# Patient Record
Sex: Female | Born: 1960 | Hispanic: No | Marital: Married | Smoking: Former smoker
Health system: Southern US, Community
[De-identification: ages and names within clinical notes are randomized; demographics above are authoritative.]

## PROBLEM LIST (undated history)

## (undated) DIAGNOSIS — G4700 Insomnia, unspecified: Secondary | ICD-10-CM

## (undated) DIAGNOSIS — Z803 Family history of malignant neoplasm of breast: Secondary | ICD-10-CM

## (undated) DIAGNOSIS — R61 Generalized hyperhidrosis: Secondary | ICD-10-CM

## (undated) DIAGNOSIS — R5383 Other fatigue: Secondary | ICD-10-CM

## (undated) HISTORY — DX: Insomnia, unspecified: G47.00

## (undated) HISTORY — DX: Other fatigue: R53.83

## (undated) HISTORY — PX: KIDNEY SURGERY: SHX687

## (undated) HISTORY — DX: Generalized hyperhidrosis: R61

## (undated) HISTORY — DX: Family history of malignant neoplasm of breast: Z80.3

## (undated) HISTORY — PX: FOOT SURGERY: SHX648

---

## 2004-07-06 ENCOUNTER — Ambulatory Visit: Payer: Self-pay | Admitting: Unknown Physician Specialty

## 2005-09-14 ENCOUNTER — Ambulatory Visit: Payer: Self-pay | Admitting: Unknown Physician Specialty

## 2005-09-22 ENCOUNTER — Ambulatory Visit: Payer: Self-pay | Admitting: Unknown Physician Specialty

## 2006-09-26 ENCOUNTER — Ambulatory Visit: Payer: Self-pay

## 2007-10-20 ENCOUNTER — Emergency Department: Payer: Self-pay | Admitting: Emergency Medicine

## 2007-11-05 ENCOUNTER — Ambulatory Visit: Payer: Self-pay | Admitting: Unknown Physician Specialty

## 2007-11-18 ENCOUNTER — Ambulatory Visit: Payer: Self-pay | Admitting: Unknown Physician Specialty

## 2008-05-08 HISTORY — PX: COLONOSCOPY: SHX174

## 2008-11-17 ENCOUNTER — Ambulatory Visit: Payer: Self-pay | Admitting: Unknown Physician Specialty

## 2009-07-08 ENCOUNTER — Ambulatory Visit: Payer: Self-pay | Admitting: Family Medicine

## 2010-01-13 ENCOUNTER — Ambulatory Visit: Payer: Self-pay | Admitting: Unknown Physician Specialty

## 2011-06-28 ENCOUNTER — Ambulatory Visit: Payer: Self-pay | Admitting: Unknown Physician Specialty

## 2013-06-23 ENCOUNTER — Ambulatory Visit: Payer: Self-pay | Admitting: Podiatry

## 2013-07-01 ENCOUNTER — Encounter: Payer: Self-pay | Admitting: Podiatry

## 2013-07-02 ENCOUNTER — Ambulatory Visit: Payer: Self-pay | Admitting: Podiatry

## 2014-06-02 ENCOUNTER — Ambulatory Visit: Payer: Self-pay | Admitting: Family Medicine

## 2014-11-04 ENCOUNTER — Telehealth: Payer: Self-pay

## 2014-11-04 NOTE — Telephone Encounter (Signed)
LMTCB-received a form for biometric screening from [employer]-i think it is for her job-patient needs appt if she wants this to be filled out-either follow up or CPE after July 7th. Thanks-aa

## 2014-11-04 NOTE — Telephone Encounter (Signed)
Pt advised and appt made-aa 

## 2014-12-30 ENCOUNTER — Ambulatory Visit (INDEPENDENT_AMBULATORY_CARE_PROVIDER_SITE_OTHER): Payer: BLUE CROSS/BLUE SHIELD | Admitting: Family Medicine

## 2014-12-30 ENCOUNTER — Encounter: Payer: Self-pay | Admitting: Family Medicine

## 2014-12-30 VITALS — BP 128/84 | HR 72 | Temp 97.8°F | Resp 14 | Ht 66.25 in | Wt 169.0 lb

## 2014-12-30 DIAGNOSIS — G4700 Insomnia, unspecified: Secondary | ICD-10-CM

## 2014-12-30 DIAGNOSIS — Z139 Encounter for screening, unspecified: Secondary | ICD-10-CM

## 2014-12-30 DIAGNOSIS — Z0000 Encounter for general adult medical examination without abnormal findings: Secondary | ICD-10-CM

## 2014-12-30 MED ORDER — ALPRAZOLAM 0.5 MG PO TABS: 0.5000 mg | ORAL_TABLET | Freq: Every day | ORAL | Status: DC

## 2014-12-30 NOTE — Progress Notes (Signed)
Patient ID: Tonya Guzman, female   DOB: 11/01/1960, 54 y.o.   MRN: [id number] Visit Date: 12/30/2014  Today's Provider: Wilhemena Durie, MD   Chief Complaint  Patient presents with  . Annual Exam   Subjective:  Tonya Guzman is a 54 y.o. female who presents today for health maintenance and complete physical. She feels well. She reports exercising speriadically. She reports she is sleeping well.  LAST: Colonoscopy 01/05/11  EKG-07/16/09  Tdap 03/13/07  Pap smear -2015 per patient with Dr. Vernie Ammons.  Mammogram 2015 per patient.  Review of Systems  Constitutional: Negative.   HENT: Negative.   Eyes: Negative.   Respiratory: Negative.   Cardiovascular: Negative.   Gastrointestinal: Negative.   Endocrine: Negative.   Genitourinary: Negative.   Musculoskeletal: Negative.   Skin: Negative.   Allergic/Immunologic: Negative.   Neurological: Negative.   Hematological: Negative.   Psychiatric/Behavioral: Negative.     Social History   Social History  . Marital Status: Married    Spouse Name: N/A  . Number of Children: N/A  . Years of Education: N/A   Occupational History  . Not on file.   Social History Main Topics  . Smoking status: Former Smoker -- 0.25 packs/day for 1 years    Types: Cigarettes    Quit date: 05/08/2012  . Smokeless tobacco: Never Used  . Alcohol Use: Yes     Comment: 3 drinks a week  . Drug Use: No  . Sexual Activity: Yes   Other Topics Concern  . Not on file   Social History Narrative    There are no active problems to display for this patient.   Past Surgical History  Procedure Laterality Date  . Cesarean section    . Foot surgery Right   . Kidney surgery      Her family history includes Heart attack in her father; Heart disease in her father; Hepatitis C in her brother; Hyperlipidemia in her father; Hypertension in her father, mother, sister, and sister; Pneumonia in her mother.    Outpatient Prescriptions Prior to Visit  Medication  Sig Dispense Refill  . ALREX 0.2 % SUSP     . ESTRACE VAGINAL 0.1 MG/GM vaginal cream     . NASONEX 50 MCG/ACT nasal spray     . ALPRAZolam (XANAX) 0.5 MG tablet     . clarithromycin (BIAXIN) 500 MG tablet      No facility-administered medications prior to visit.    Patient Care Team: Jerrol Banana., MD as PCP - General (Family Medicine)     Objective:   Vitals:  Filed Vitals:   12/30/14 1044  BP: 128/84  Pulse: 72  Temp: 97.8 F (36.6 C)  Resp: 14  Height: 5' 6.25" (1.683 m)  Weight: 169 lb (76.658 kg)    Physical Exam  Constitutional: She is oriented to person, place, and time. She appears well-developed and well-nourished.  HENT:  Head: Normocephalic and atraumatic.  Right Ear: External ear normal.  Left Ear: External ear normal.  Nose: Nose normal.  Eyes: Conjunctivae are normal.  Neck: Neck supple.  Cardiovascular: Normal rate, regular rhythm and normal heart sounds.   Pulmonary/Chest: Effort normal and breath sounds normal.  Abdominal: Soft. Bowel sounds are normal.  Neurological: She is alert and oriented to person, place, and time.  Skin: Skin is warm and dry.  Psychiatric: She has a normal mood and affect. Her behavior is normal. Judgment and thought content normal.     Depression Screen  PHQ 2/9 Scores 12/30/2014  PHQ - 2 Score 0      Assessment & Plan:   1. Annual physical exam  - CBC with Differential/Platelet - Comprehensive Metabolic Panel (CMET) - Lipid Panel With LDL/HDL Ratio - TSH  2. Insomnia  - ALPRAZolam (XANAX) 0.5 MG tablet; Take 1 tablet (0.5 mg total) by mouth at bedtime.  Dispense: 30 tablet; Refill: 5  3. Screening  - Nicotine/cotinine metabolites   I have done the exam and reviewed the above chart and it is accurate to the best of my knowledge.

## 2015-01-03 LAB — NICOTINE/COTININE METABOLITES
Cotinine: 23.5 ng/mL
Nicotine: 2.9 ng/mL

## 2015-01-03 LAB — CBC WITH DIFFERENTIAL/PLATELET
BASOS: 1 %
Basophils Absolute: 0 10*3/uL (ref 0.0–0.2)
EOS (ABSOLUTE): 0.1 10*3/uL (ref 0.0–0.4)
Eos: 2 %
HEMOGLOBIN: 13.4 g/dL (ref 11.1–15.9)
Hematocrit: 40.7 % (ref 34.0–46.6)
IMMATURE GRANS (ABS): 0 10*3/uL (ref 0.0–0.1)
IMMATURE GRANULOCYTES: 0 %
LYMPHS: 28 %
Lymphocytes Absolute: 1.3 10*3/uL (ref 0.7–3.1)
MCH: 30.9 pg (ref 26.6–33.0)
MCHC: 32.9 g/dL (ref 31.5–35.7)
MCV: 94 fL (ref 79–97)
MONOCYTES: 10 %
Monocytes Absolute: 0.5 10*3/uL (ref 0.1–0.9)
NEUTROS PCT: 59 %
Neutrophils Absolute: 2.7 10*3/uL (ref 1.4–7.0)
Platelets: 262 10*3/uL (ref 150–379)
RBC: 4.33 x10E6/uL (ref 3.77–5.28)
RDW: 13.8 % (ref 12.3–15.4)
WBC: 4.5 10*3/uL (ref 3.4–10.8)

## 2015-01-03 LAB — COMPREHENSIVE METABOLIC PANEL
A/G RATIO: 1.7 (ref 1.1–2.5)
ALT: 21 IU/L (ref 0–32)
AST: 19 IU/L (ref 0–40)
Albumin: 4.7 g/dL (ref 3.5–5.5)
Alkaline Phosphatase: 53 IU/L (ref 39–117)
BUN/Creatinine Ratio: 14 (ref 9–23)
BUN: 12 mg/dL (ref 6–24)
Bilirubin Total: 0.5 mg/dL (ref 0.0–1.2)
CALCIUM: 9.8 mg/dL (ref 8.7–10.2)
CO2: 24 mmol/L (ref 18–29)
Chloride: 100 mmol/L (ref 97–108)
Creatinine, Ser: 0.86 mg/dL (ref 0.57–1.00)
GFR, EST AFRICAN AMERICAN: 88 mL/min/{1.73_m2} (ref >59)
GFR, EST NON AFRICAN AMERICAN: 76 mL/min/{1.73_m2} (ref >59)
Globulin, Total: 2.7 g/dL (ref 1.5–4.5)
Glucose: 99 mg/dL (ref 65–99)
POTASSIUM: 4.8 mmol/L (ref 3.5–5.2)
Sodium: 140 mmol/L (ref 134–144)
TOTAL PROTEIN: 7.4 g/dL (ref 6.0–8.5)

## 2015-01-03 LAB — LIPID PANEL WITH LDL/HDL RATIO
Cholesterol, Total: 263 mg/dL — ABNORMAL HIGH (ref 100–199)
HDL: 75 mg/dL (ref >39)
LDL Calculated: 169 mg/dL — ABNORMAL HIGH (ref 0–99)
LDL/HDL RATIO: 2.3 ratio (ref 0.0–3.2)
Triglycerides: 96 mg/dL (ref 0–149)
VLDL CHOLESTEROL CAL: 19 mg/dL (ref 5–40)

## 2015-01-03 LAB — TSH: TSH: 2.03 u[IU]/mL (ref 0.450–4.500)

## 2015-01-06 ENCOUNTER — Encounter: Payer: Self-pay | Admitting: Family Medicine

## 2015-03-30 LAB — HM MAMMOGRAPHY: HM MAMMO: NORMAL

## 2015-06-22 ENCOUNTER — Other Ambulatory Visit: Payer: Self-pay | Admitting: Family Medicine

## 2015-06-24 ENCOUNTER — Other Ambulatory Visit: Payer: Self-pay | Admitting: Family Medicine

## 2015-07-01 ENCOUNTER — Other Ambulatory Visit: Payer: Self-pay | Admitting: Family Medicine

## 2015-07-01 ENCOUNTER — Ambulatory Visit: Payer: BLUE CROSS/BLUE SHIELD | Admitting: Family Medicine

## 2015-07-03 NOTE — Telephone Encounter (Signed)
Needs appointment this spring for further refills

## 2015-07-21 ENCOUNTER — Ambulatory Visit (INDEPENDENT_AMBULATORY_CARE_PROVIDER_SITE_OTHER): Payer: BLUE CROSS/BLUE SHIELD | Admitting: Family Medicine

## 2015-07-21 ENCOUNTER — Encounter: Payer: Self-pay | Admitting: Family Medicine

## 2015-07-21 VITALS — BP 110/80 | HR 72 | Temp 98.4°F | Resp 14 | Wt 169.2 lb

## 2015-07-21 DIAGNOSIS — F419 Anxiety disorder, unspecified: Secondary | ICD-10-CM | POA: Diagnosis not present

## 2015-07-21 DIAGNOSIS — J40 Bronchitis, not specified as acute or chronic: Secondary | ICD-10-CM | POA: Diagnosis not present

## 2015-07-21 MED ORDER — AZITHROMYCIN 250 MG PO TABS: ORAL_TABLET | ORAL | Status: DC

## 2015-07-21 MED ORDER — ALPRAZOLAM 0.5 MG PO TABS: 0.5000 mg | ORAL_TABLET | Freq: Every day | ORAL | Status: DC

## 2015-07-21 NOTE — Progress Notes (Signed)
Patient ID: Tonya Guzman, female   DOB: 25-Sep-1960, 55 y.o.   MRN: [id number]   Patient: Tonya Guzman Female    DOB: 1961-04-29   55 y.o.   MRN: [id number] Visit Date: 07/21/2015  Today's Provider: Wilhemena Durie, MD   Chief Complaint  Patient presents with  . Insomnia  . Follow-up  . URI   Subjective:    Insomnia Primary symptoms: sleep disturbance, difficulty falling asleep, frequent awakening.  The current episode started more than one year. The problem occurs nightly. The problem is unchanged. The symptoms are aggravated by bed partner, caffeine and work stress. How many beverages per day that contain caffeine: 0 - 1.  Types of beverages you drink: coffee. Past treatments include medication (Alprazolam 0.5mg ). The treatment provided significant relief. Typical bedtime:  8-10 P.M..   URI  This is a new problem. Episode onset: Sunday. The maximum temperature recorded prior to her arrival was 100.4 - 100.9 F. Associated symptoms include congestion, coughing, headaches and a sore throat. Associated symptoms comments: Fever, chills. Treatments tried: OTC medications. The treatment provided mild relief.      Previous Medications   ALREX 0.2 % SUSP       ESTRACE VAGINAL 0.1 MG/GM VAGINAL CREAM       NAPROXEN (NAPROSYN) 500 MG TABLET    TAKE 1 TABLET BY MOUTH TWICE A DAY AS NEEDED FOR PAIN   NASONEX 50 MCG/ACT NASAL SPRAY    Reported on 07/21/2015   No Known Allergies  Review of Systems  Constitutional: Positive for fever.  HENT: Positive for congestion and sore throat.        Ear fullness    Eyes: Negative.   Respiratory: Positive for cough.   Cardiovascular: Negative.   Gastrointestinal: Negative.   Endocrine: Negative.   Genitourinary: Negative.   Musculoskeletal: Negative.   Allergic/Immunologic: Negative.   Neurological: Positive for headaches.  Hematological: Negative.   Psychiatric/Behavioral: Positive for sleep disturbance. The patient has insomnia.     Social  History  Substance Use Topics  . Smoking status: Former Smoker -- 0.25 packs/day for 1 years    Types: Cigarettes    Quit date: 05/08/2012  . Smokeless tobacco: Never Used  . Alcohol Use: Yes     Comment: 3 drinks a week   Objective:   BP 110/80 mmHg  Pulse 72  Temp(Src) 98.4 F (36.9 C) (Oral)  Resp 14  Wt 169 lb 3.2 oz (76.749 kg)  Physical Exam  Constitutional: She is oriented to person, place, and time. She appears well-developed and well-nourished.  HENT:  Head: Normocephalic and atraumatic.  Right Ear: External ear normal.  Left Ear: External ear normal.  Mouth/Throat: Oropharynx is clear and moist.  Eyes: Conjunctivae and EOM are normal. Pupils are equal, round, and reactive to light.  Neck: Normal range of motion. Neck supple.  Cardiovascular: Normal rate, regular rhythm and normal heart sounds.   Pulmonary/Chest: Effort normal and breath sounds normal.  Abdominal: Soft.  Musculoskeletal: Normal range of motion.  Neurological: She is alert and oriented to person, place, and time. She has normal reflexes.  Skin: Skin is warm and dry.  Psychiatric: She has a normal mood and affect. Her behavior is normal. Judgment and thought content normal.        Assessment & Plan:     1. Bronchitis, Take Robitussin DM for cough - azithromycin (ZITHROMAX) 250 MG tablet; 2 PO day 1 and then 1 daily  Dispense: 6 tablet; Refill: 0  2. Acute anxiety Stable continue medication - Alprazolam (XANAX) 0.5 MG tablet; Take 1 tablet (0.5 mg total) by mouth at bedtime.  Dispense: 30 tablet; Refill: 5 3. Chronic insomnia Patient is again advised to try to use less of her sleeping pills at all possible. 4. Cough Robitussin use during the day and night as above. Follow up: No Follow-up on file.

## 2015-08-30 ENCOUNTER — Ambulatory Visit (INDEPENDENT_AMBULATORY_CARE_PROVIDER_SITE_OTHER): Payer: BLUE CROSS/BLUE SHIELD | Admitting: Family Medicine

## 2015-08-30 ENCOUNTER — Telehealth: Payer: Self-pay | Admitting: Family Medicine

## 2015-08-30 VITALS — BP 112/80 | HR 72 | Temp 97.8°F | Resp 14

## 2015-08-30 DIAGNOSIS — R51 Headache: Secondary | ICD-10-CM

## 2015-08-30 DIAGNOSIS — R42 Dizziness and giddiness: Secondary | ICD-10-CM | POA: Diagnosis not present

## 2015-08-30 DIAGNOSIS — R519 Headache, unspecified: Secondary | ICD-10-CM

## 2015-08-30 NOTE — Telephone Encounter (Signed)
Please review-aa 

## 2015-08-30 NOTE — Telephone Encounter (Signed)
Insurance company would like peer to peer to get MRI/MRA of brain approved.Phone [phone].Member # [id number]

## 2015-08-30 NOTE — Progress Notes (Signed)
Patient ID: Tonya Guzman, female   DOB: [birth year], 55 y.o.   MRN: [id number]    Subjective:  HPI  Patient states that for about 7 days she has had a daily headache that is located in the center of her face. They do not last all day [place] every day, maybe for a few hours- 4 to 5 hours. Pain does wake her up at night sometimes-on the scale of 1 to 10 patient states pain is around 5 to 8. She states she does have high pain tolerance. She took [facility] one night when it woke her up but it did not help and then she developed chills and ran a fever of 100 but not since that one night. She has had some dizziness with this, "felt like she was in a fog and could not concentrate well and had hard time speaking what she was thinking and trying to say on Tuesday last week April 18th." She has a history of migraines years ago before she went through menopause and use to take Imitrex.  Prior to Admission medications   Medication Sig Start Date End Date Taking? Authorizing Provider  ALPRAZolam Duanne Moron) 0.5 MG tablet Take 1 tablet (0.5 mg total) by mouth at bedtime. 07/21/15  Yes Richard Maceo Pro., MD  ESTRACE VAGINAL 0.1 MG/GM vaginal cream  06/04/13  Yes Historical Provider, MD  naproxen (NAPROSYN) 500 MG tablet TAKE 1 TABLET BY MOUTH TWICE A DAY AS NEEDED FOR PAIN 06/23/15  Yes Jerrol Banana., MD  NASONEX 50 MCG/ACT nasal spray Reported on 07/21/2015 06/15/13  Yes Historical Provider, MD    There are no active problems to display for this patient.   Past Medical History  Diagnosis Date  . Fatigue   . Night sweats     Social History   Social History  . Marital Status: Married    Spouse Name: N/A  . Number of Children: N/A  . Years of Education: N/A   Occupational History  . Not on file.   Social History Main Topics  . Smoking status: Former Smoker -- 0.25 packs/day for 1 years    Types: Cigarettes    Quit date: 05/08/2012  . Smokeless tobacco: Never Used  . Alcohol Use: Yes     Comment: 3  drinks a week  . Drug Use: No  . Sexual Activity: Yes   Other Topics Concern  . Not on file   Social History Narrative    No Known Allergies  Review of Systems  Constitutional: Positive for malaise/fatigue.  Respiratory: Negative.   Cardiovascular: Negative.   Musculoskeletal: Negative.   Neurological: Positive for dizziness and headaches.     There is no immunization history on file for this patient. Objective:  BP 112/80 mmHg  Pulse 72  Temp(Src) 97.8 F (36.6 C)  Resp 14  Physical Exam  Constitutional: She is oriented to person, place, and time and well-developed, well-nourished, and in no distress.  HENT:  Head: Normocephalic and atraumatic.  Right Ear: External ear normal.  Left Ear: External ear normal.  Mouth/Throat: Oropharynx is clear and moist.  Eyes: Conjunctivae are normal. Pupils are equal, round, and reactive to light.  Neck: Normal range of motion. Neck supple.  Cardiovascular: Normal rate, regular rhythm, normal heart sounds and intact distal pulses.   No murmur heard. Pulmonary/Chest: Effort normal and breath sounds normal.  Abdominal: Soft.  Musculoskeletal: Normal range of motion.  Neurological: She is alert and oriented to person, place, and time.  She displays normal reflexes. No cranial nerve deficit. Gait normal. Coordination normal.  Cranial nerves intact. Gait normal. Grossly nonfocal exam.  Skin: Skin is warm and dry.  Psychiatric: Mood, memory, affect and judgment normal.    Lab Results  Component Value Date   WBC 4.5 12/30/2014   HCT 40.7 12/30/2014   PLT 262 12/30/2014   GLUCOSE 99 12/30/2014   CHOL 263* 12/30/2014   TRIG 96 12/30/2014   HDL 75 12/30/2014   LDLCALC 169* 12/30/2014   TSH 2.030 12/30/2014    CMP     Component Value Date/Time   NA 140 12/30/2014 1129   K 4.8 12/30/2014 1129   CL 100 12/30/2014 1129   CO2 24 12/30/2014 1129   GLUCOSE 99 12/30/2014 1129   BUN 12 12/30/2014 1129   CREATININE 0.86 12/30/2014  1129   CALCIUM 9.8 12/30/2014 1129   PROT 7.4 12/30/2014 1129   ALBUMIN 4.7 12/30/2014 1129   AST 19 12/30/2014 1129   ALT 21 12/30/2014 1129   ALKPHOS 53 12/30/2014 1129   BILITOT 0.5 12/30/2014 1129   GFRNONAA 76 12/30/2014 1129   GFRAA 88 12/30/2014 1129    Assessment and Plan :  1. Daily headache New for the patient. These are not like migraines she use to have. She is having neurological symptoms with difficulty of speaking. Will get MRI and MRA. Will also get neurology referral started in case headaches continue. Advised patient to try and take [facility] powder if this helps the headaches.Continue rare prn BC powder. This headache is different than her migraines which stopped when she had menopause of couple years ago. 2. Dizziness Follow-up with neurology. I have done the exam and reviewed the above chart and it is accurate to the best of my knowledge.  Patient was seen and examined by Dr. Eulas Post and note was scribed by Theressa Millard, RMA.    Miguel Aschoff MD [facility] 08/30/2015 2:41 PM

## 2015-08-31 NOTE — Telephone Encounter (Signed)
Approval [id number] Valid 08/30/15-09/28/15 thanks

## 2015-09-16 ENCOUNTER — Telehealth: Payer: Self-pay

## 2015-09-16 NOTE — Telephone Encounter (Signed)
Called [facility] and they do have patient record of MRI/MRA that was done in [place], records requested again for [place] to review, pt advised of this-aa

## 2015-09-16 NOTE — Telephone Encounter (Signed)
Pt advised of lab results, see scanned imaging-aa

## 2015-09-17 ENCOUNTER — Encounter: Payer: Self-pay | Admitting: Family Medicine

## 2016-01-25 ENCOUNTER — Encounter: Payer: BLUE CROSS/BLUE SHIELD | Admitting: Family Medicine

## 2016-01-26 ENCOUNTER — Encounter: Payer: Self-pay | Admitting: Family Medicine

## 2016-01-26 ENCOUNTER — Other Ambulatory Visit: Payer: Self-pay | Admitting: Family Medicine

## 2016-01-26 ENCOUNTER — Ambulatory Visit (INDEPENDENT_AMBULATORY_CARE_PROVIDER_SITE_OTHER): Payer: BLUE CROSS/BLUE SHIELD | Admitting: Family Medicine

## 2016-01-26 VITALS — BP 98/60 | HR 84 | Temp 98.0°F | Resp 16 | Ht 66.0 in | Wt 167.0 lb

## 2016-01-26 DIAGNOSIS — Z1159 Encounter for screening for other viral diseases: Secondary | ICD-10-CM

## 2016-01-26 DIAGNOSIS — M2550 Pain in unspecified joint: Secondary | ICD-10-CM | POA: Diagnosis not present

## 2016-01-26 DIAGNOSIS — F419 Anxiety disorder, unspecified: Secondary | ICD-10-CM | POA: Diagnosis not present

## 2016-01-26 DIAGNOSIS — Z0000 Encounter for general adult medical examination without abnormal findings: Secondary | ICD-10-CM | POA: Diagnosis not present

## 2016-01-26 LAB — POCT URINALYSIS DIPSTICK
Bilirubin, UA: NEGATIVE
Glucose, UA: NEGATIVE
KETONES UA: NEGATIVE
Leukocytes, UA: NEGATIVE
Nitrite, UA: NEGATIVE
PROTEIN UA: NEGATIVE
RBC UA: NEGATIVE
SPEC GRAV UA: 1.015
UROBILINOGEN UA: 0.2
pH, UA: 6

## 2016-01-26 NOTE — Progress Notes (Signed)
Patient: Tonya Guzman, Female    DOB: 06/22/1960, 55 y.o.   MRN: [id number] Visit Date: 01/26/2016  Today's Provider: Wilhemena Durie, MD   Chief Complaint  Patient presents with  . Annual Exam   Subjective:    Annual physical exam Tonya Guzman is a 55 y.o. female who presents today for health maintenance and complete physical. She feels poorly. She reports she is  Not exercising. She reports she is sleeping fairly well. But the past couple of nights she has not been sleeping well because of her joint pain. She reports that the joint pain just randomly started in both ankles, both elbows, both shoulder, and both legs. She reports that she had a low grade fever of 99.0 when all this was going on and increased fatigue.   ----------------------------------------------------------------- Colonoscopy- 01/05/11 1 polyp, internal hemorrhoids, otherwise normal  Pap and mammogram through GYN  Immunization History  Administered Date(s) Administered  . Tdap 03/13/2007     Review of Systems  Constitutional: Positive for diaphoresis.  HENT: Negative.   Eyes: Positive for itching.  Respiratory: Positive for apnea.   Cardiovascular: Positive for leg swelling.  Gastrointestinal: Positive for abdominal distention, abdominal pain and constipation.  Endocrine: Negative.   Genitourinary: Negative.   Musculoskeletal: Positive for arthralgias, back pain, joint swelling, neck pain and neck stiffness.  Allergic/Immunologic: Negative.   Neurological: Positive for numbness.  Hematological: Negative.   Psychiatric/Behavioral: Negative.     Social History      She  reports that she quit smoking about 3 years ago. Her smoking use included Cigarettes. She has a 0.25 pack-year smoking history. She has never used smokeless tobacco. She reports that she drinks alcohol. She reports that she does not use drugs.       Social History   Social History  . Marital status: Married    Spouse name:  N/A  . Number of children: N/A  . Years of education: N/A   Social History Main Topics  . Smoking status: Former Smoker    Packs/day: 0.25    Years: 1.00    Types: Cigarettes    Quit date: 05/08/2012  . Smokeless tobacco: Never Used  . Alcohol use Yes     Comment: 3 drinks a week  . Drug use: No  . Sexual activity: Yes   Other Topics Concern  . None   Social History Narrative  . None    Past Medical History:  Diagnosis Date  . Fatigue   . Night sweats      There are no active problems to display for this patient.   Past Surgical History:  Procedure Laterality Date  . CESAREAN SECTION    . FOOT SURGERY Right   . KIDNEY SURGERY      Family History        Family Status  Relation Status  . Mother Alive  . Father Deceased at age 63  . Sister Alive  . Brother Alive  . Sister Alive  . Brother Deceased        Her family history includes Heart attack in her father; Heart disease in her father; Hepatitis C in her brother; Hyperlipidemia in her father; Hypertension in her father, mother, sister, and sister; Pneumonia in her mother.    No Known Allergies  No outpatient prescriptions have been marked as taking for the 01/26/16 encounter (Office Visit) with Jerrol Banana., MD.    Patient Care Team: Janine Ores  Cranford Mon., MD as PCP - General (Family Medicine)     Objective:   Vitals: There were no vitals taken for this visit.  BP 98/60   HR 84  Temp 98  Wt 167ibs  Ht 66inches Physical Exam  Constitutional: She is oriented to person, place, and time. She appears well-developed and well-nourished.  HENT:  Head: Normocephalic and atraumatic.  Right Ear: External ear normal.  Left Ear: External ear normal.  Nose: Nose normal.  Mouth/Throat: Oropharynx is clear and moist.  Eyes: Conjunctivae and EOM are normal. Pupils are equal, round, and reactive to light.  Neck: Normal range of motion. Neck supple.  Cardiovascular: Normal rate, regular rhythm, normal  heart sounds and intact distal pulses.   Pulmonary/Chest: Effort normal and breath sounds normal.  Abdominal: Soft. Bowel sounds are normal.  Musculoskeletal: Normal range of motion.  Mild chronic swelling of the left ankle from 2009 ankle fracture.  Her left wrist has some mild swelling medially along the full range of motion with no point tenderness. Snuffbox is nontender  Neurological: She is alert and oriented to person, place, and time. She has normal reflexes.  Skin: Skin is warm and dry.  Psychiatric: She has a normal mood and affect. Her behavior is normal. Judgment and thought content normal.     Depression Screen PHQ 2/9 Scores 12/30/2014  PHQ - 2 Score 0      Assessment & Plan:     Routine Health Maintenance and Physical Exam  Exercise Activities and Dietary recommendations Goals    None      Immunization History  Administered Date(s) Administered  . Tdap 03/13/2007    Health Maintenance  Topic Date Due  . Hepatitis C Screening  Jan 10, 1961  . HIV Screening  04/18/1974  . TETANUS/TDAP  04/18/1978  . PAP SMEAR  04/18/1980  . INFLUENZA VACCINE  12/07/2015  . MAMMOGRAM  03/29/2017  . COLONOSCOPY  01/04/2021   1. Annual physical exam  - CBC with Differential/Platelet - Lipid Panel With LDL/HDL Ratio - TSH - POCT urinalysis dipstick - Comprehensive metabolic panel Breasts exam pelvic exam, rectal exam per GYN. 2. Anxiety   3. Arthralgia  - ANA - Sedimentation rate - Rheumatoid factor  4. Need for hepatitis C screening test  - Hepatitis C Antibody 5. URI Resolving    HPI, Exam, and A&P Transcribed under the direction and in the presence of Burnadette Baskett L. Cranford Mon, MD  Electronically Signed: Webb Laws, CMA  Discussed health benefits of physical activity, and encouraged her to engage in regular exercise appropriate for her age and condition.   I have done the exam and reviewed the chart and it is accurate to the best of my knowledge. Miguel Aschoff M.D. [facility]  --------------------------------------------------------------------    Wilhemena Durie, MD  [facility]

## 2016-01-27 LAB — CBC WITH DIFFERENTIAL/PLATELET
Basophils Absolute: 0 10*3/uL (ref 0.0–0.2)
Basos: 0 %
EOS (ABSOLUTE): 0.1 10*3/uL (ref 0.0–0.4)
EOS: 1 %
Hematocrit: 37.5 % (ref 34.0–46.6)
Hemoglobin: 12.9 g/dL (ref 11.1–15.9)
IMMATURE GRANULOCYTES: 0 %
Immature Grans (Abs): 0 10*3/uL (ref 0.0–0.1)
LYMPHS: 21 %
Lymphocytes Absolute: 1.1 10*3/uL (ref 0.7–3.1)
MCH: 31.2 pg (ref 26.6–33.0)
MCHC: 34.4 g/dL (ref 31.5–35.7)
MCV: 91 fL (ref 79–97)
MONOCYTES: 10 %
Monocytes Absolute: 0.5 10*3/uL (ref 0.1–0.9)
NEUTROS PCT: 68 %
Neutrophils Absolute: 3.4 10*3/uL (ref 1.4–7.0)
PLATELETS: 253 10*3/uL (ref 150–379)
RBC: 4.13 x10E6/uL (ref 3.77–5.28)
RDW: 13.7 % (ref 12.3–15.4)
WBC: 5.1 10*3/uL (ref 3.4–10.8)

## 2016-01-27 LAB — COMPREHENSIVE METABOLIC PANEL
A/G RATIO: 1.8 (ref 1.2–2.2)
ALT: 62 IU/L — AB (ref 0–32)
AST: 40 IU/L (ref 0–40)
Albumin: 4.6 g/dL (ref 3.5–5.5)
Alkaline Phosphatase: 68 IU/L (ref 39–117)
BUN/Creatinine Ratio: 14 (ref 9–23)
BUN: 13 mg/dL (ref 6–24)
Bilirubin Total: 0.4 mg/dL (ref 0.0–1.2)
CALCIUM: 9.5 mg/dL (ref 8.7–10.2)
CO2: 26 mmol/L (ref 18–29)
CREATININE: 0.93 mg/dL (ref 0.57–1.00)
Chloride: 101 mmol/L (ref 96–106)
GFR, EST AFRICAN AMERICAN: 79 mL/min/{1.73_m2} (ref >59)
GFR, EST NON AFRICAN AMERICAN: 69 mL/min/{1.73_m2} (ref >59)
Globulin, Total: 2.6 g/dL (ref 1.5–4.5)
Glucose: 104 mg/dL — ABNORMAL HIGH (ref 65–99)
POTASSIUM: 4.8 mmol/L (ref 3.5–5.2)
Sodium: 140 mmol/L (ref 134–144)
TOTAL PROTEIN: 7.2 g/dL (ref 6.0–8.5)

## 2016-01-27 LAB — RHEUMATOID FACTOR

## 2016-01-27 LAB — LIPID PANEL WITH LDL/HDL RATIO
Cholesterol, Total: 240 mg/dL — ABNORMAL HIGH (ref 100–199)
HDL: 68 mg/dL (ref >39)
LDL Calculated: 151 mg/dL — ABNORMAL HIGH (ref 0–99)
LDl/HDL Ratio: 2.2 ratio units (ref 0.0–3.2)
Triglycerides: 107 mg/dL (ref 0–149)
VLDL Cholesterol Cal: 21 mg/dL (ref 5–40)

## 2016-01-27 LAB — SEDIMENTATION RATE: SED RATE: 2 mm/h (ref 0–40)

## 2016-01-27 LAB — ANA: Anti Nuclear Antibody(ANA): NEGATIVE

## 2016-01-27 LAB — HEPATITIS C ANTIBODY: Hep C Virus Ab: <0.1 s/co ratio (ref 0.0–0.9)

## 2016-01-27 LAB — TSH: TSH: 1.79 u[IU]/mL (ref 0.450–4.500)

## 2016-02-21 ENCOUNTER — Other Ambulatory Visit: Payer: Self-pay

## 2016-02-22 MED ORDER — NAPROXEN 500 MG PO TABS: 500.0000 mg | ORAL_TABLET | Freq: Two times a day (BID) | ORAL | 3 refills | Status: DC | PRN

## 2016-04-18 ENCOUNTER — Ambulatory Visit (INDEPENDENT_AMBULATORY_CARE_PROVIDER_SITE_OTHER): Payer: BLUE CROSS/BLUE SHIELD | Admitting: Obstetrics and Gynecology

## 2016-04-18 ENCOUNTER — Encounter: Payer: Self-pay | Admitting: Obstetrics and Gynecology

## 2016-04-18 VITALS — BP 113/74 | HR 76 | Ht 66.0 in | Wt 170.8 lb

## 2016-04-18 DIAGNOSIS — N951 Menopausal and female climacteric states: Secondary | ICD-10-CM

## 2016-04-18 DIAGNOSIS — Z1231 Encounter for screening mammogram for malignant neoplasm of breast: Secondary | ICD-10-CM

## 2016-04-18 DIAGNOSIS — Z01419 Encounter for gynecological examination (general) (routine) without abnormal findings: Secondary | ICD-10-CM

## 2016-04-18 DIAGNOSIS — Z780 Asymptomatic menopausal state: Secondary | ICD-10-CM | POA: Diagnosis not present

## 2016-04-18 DIAGNOSIS — Z1239 Encounter for other screening for malignant neoplasm of breast: Secondary | ICD-10-CM

## 2016-04-18 DIAGNOSIS — Z1211 Encounter for screening for malignant neoplasm of colon: Secondary | ICD-10-CM | POA: Diagnosis not present

## 2016-04-18 MED ORDER — ESTRADIOL 0.1 MG/GM VA CREA: 0.2500 | TOPICAL_CREAM | VAGINAL | 1 refills | Status: DC

## 2016-04-18 NOTE — Patient Instructions (Signed)
1. Pap smear is done 2. Mammogram is ordered 3. Stool guaiac card testing for colon cancer screening is ordered 4. Screening labs are to be obtained through primary care 5. Recommend calcium with vitamin D supplementation 6. Trial of Intrarosa intravaginal suppositories for vaginal dryness 7. Lubricant: Jo H2O lubricant recommended 8. Calcium with vitamin D supplementation is encouraged   Health Maintenance for Postmenopausal Women Introduction Menopause is a normal process in which your reproductive ability comes to an end. This process happens gradually over a span of months to years, usually between the ages of 67 and 47. Menopause is complete when you have missed 12 consecutive menstrual periods. It is important to talk with your health care provider about some of the most common conditions that affect postmenopausal women, such as heart disease, cancer, and bone loss (osteoporosis). Adopting a healthy lifestyle and getting preventive care can help to promote your health and wellness. Those actions can also lower your chances of developing some of these common conditions. What should I know about menopause? During menopause, you may experience a number of symptoms, such as:  Moderate-to-severe hot flashes.  Night sweats.  Decrease in sex drive.  Mood swings.  Headaches.  Tiredness.  Irritability.  Memory problems.  Insomnia. Choosing to treat or not to treat menopausal changes is an individual decision that you make with your health care provider. What should I know about hormone replacement therapy and supplements? Hormone therapy products are effective for treating symptoms that are associated with menopause, such as hot flashes and night sweats. Hormone replacement carries certain risks, especially as you become older. If you are thinking about using estrogen or estrogen with progestin treatments, discuss the benefits and risks with your health care provider. What should I  know about heart disease and stroke? Heart disease, heart attack, and stroke become more likely as you age. This may be due, in part, to the hormonal changes that your body experiences during menopause. These can affect how your body processes dietary fats, triglycerides, and cholesterol. Heart attack and stroke are both medical emergencies. There are many things that you can do to help prevent heart disease and stroke:  Have your blood pressure checked at least every 1-2 years. High blood pressure causes heart disease and increases the risk of stroke.  If you are 41-70 years old, ask your health care provider if you should take aspirin to prevent a heart attack or a stroke.  Do not use any tobacco products, including cigarettes, chewing tobacco, or electronic cigarettes. If you need help quitting, ask your health care provider.  It is important to eat a healthy diet and maintain a healthy weight.  Be sure to include plenty of vegetables, fruits, low-fat dairy products, and lean protein.  Avoid eating foods that are high in solid fats, added sugars, or salt (sodium).  Get regular exercise. This is one of the most important things that you can do for your health.  Try to exercise for at least 150 minutes each week. The type of exercise that you do should increase your heart rate and make you sweat. This is known as moderate-intensity exercise.  Try to do strengthening exercises at least twice each week. Do these in addition to the moderate-intensity exercise.  Know your numbers.Ask your health care provider to check your cholesterol and your blood glucose. Continue to have your blood tested as directed by your health care provider. What should I know about cancer screening? There are several types of cancer.  Take the following steps to reduce your risk and to catch any cancer development as early as possible. Breast Cancer  Practice breast self-awareness.  This means understanding how  your breasts normally appear and feel.  It also means doing regular breast self-exams. Let your health care provider know about any changes, no matter how small.  If you are 35 or older, have a clinician do a breast exam (clinical breast exam or CBE) every year. Depending on your age, family history, and medical history, it may be recommended that you also have a yearly breast X-ray (mammogram).  If you have a family history of breast cancer, talk with your health care provider about genetic screening.  If you are at high risk for breast cancer, talk with your health care provider about having an MRI and a mammogram every year.  Breast cancer (BRCA) gene test is recommended for women who have family members with BRCA-related cancers. Results of the assessment will determine the need for genetic counseling and BRCA1 and for BRCA2 testing. BRCA-related cancers include these types:  Breast. This occurs in males or females.  Ovarian.  Tubal. This may also be called fallopian tube cancer.  Cancer of the abdominal or pelvic lining (peritoneal cancer).  Prostate.  Pancreatic. Cervical, Uterine, and Ovarian Cancer  Your health care provider may recommend that you be screened regularly for cancer of the pelvic organs. These include your ovaries, uterus, and vagina. This screening involves a pelvic exam, which includes checking for microscopic changes to the surface of your cervix (Pap test).  For women ages 21-65, health care providers may recommend a pelvic exam and a Pap test every three years. For women ages 23-65, they may recommend the Pap test and pelvic exam, combined with testing for human papilloma virus (HPV), every five years. Some types of HPV increase your risk of cervical cancer. Testing for HPV may also be done on women of any age who have unclear Pap test results.  Other health care providers may not recommend any screening for nonpregnant women who are considered low risk for  pelvic cancer and have no symptoms. Ask your health care provider if a screening pelvic exam is right for you.  If you have had past treatment for cervical cancer or a condition that could lead to cancer, you need Pap tests and screening for cancer for at least 20 years after your treatment. If Pap tests have been discontinued for you, your risk factors (such as having a new sexual partner) need to be reassessed to determine if you should start having screenings again. Some women have medical problems that increase the chance of getting cervical cancer. In these cases, your health care provider may recommend that you have screening and Pap tests more often.  If you have a family history of uterine cancer or ovarian cancer, talk with your health care provider about genetic screening.  If you have vaginal bleeding after reaching menopause, tell your health care provider.  There are currently no reliable tests available to screen for ovarian cancer. Lung Cancer  Lung cancer screening is recommended for adults 62-47 years old who are at high risk for lung cancer because of a history of smoking. A yearly low-dose CT scan of the lungs is recommended if you:  Currently smoke.  Have a history of at least 30 pack-years of smoking and you currently smoke or have quit within the past 15 years. A pack-year is smoking an average of one pack of cigarettes  per day for one year. Yearly screening should:  Continue until it has been 15 years since you quit.  Stop if you develop a health problem that would prevent you from having lung cancer treatment. Colorectal Cancer  This type of cancer can be detected and can often be prevented.  Routine colorectal cancer screening usually begins at age 36 and continues through age 2.  If you have risk factors for colon cancer, your health care provider may recommend that you be screened at an earlier age.  If you have a family history of colorectal cancer, talk with  your health care provider about genetic screening.  Your health care provider may also recommend using home test kits to check for hidden blood in your stool.  A small camera at the end of a tube can be used to examine your colon directly (sigmoidoscopy or colonoscopy). This is done to check for the earliest forms of colorectal cancer.  Direct examination of the colon should be repeated every 5-10 years until age 67. However, if early forms of precancerous polyps or small growths are found or if you have a family history or genetic risk for colorectal cancer, you may need to be screened more often. Skin Cancer  Check your skin from head to toe regularly.  Monitor any moles. Be sure to tell your health care provider:  About any new moles or changes in moles, especially if there is a change in a mole's shape or color.  If you have a mole that is larger than the size of a pencil eraser.  If any of your family members has a history of skin cancer, especially at a young age, talk with your health care provider about genetic screening.  Always use sunscreen. Apply sunscreen liberally and repeatedly throughout the day.  Whenever you are outside, protect yourself by wearing [place] sleeves, pants, a wide-brimmed hat, and sunglasses. What should I know about osteoporosis? Osteoporosis is a condition in which bone destruction happens more quickly than new bone creation. After menopause, you may be at an increased risk for osteoporosis. To help prevent osteoporosis or the bone fractures that can happen because of osteoporosis, the following is recommended:  If you are 27-79 years old, get at least 1,000 mg of calcium and at least 600 mg of vitamin D per day.  If you are older than age 7 but younger than age 6, get at least 1,200 mg of calcium and at least 600 mg of vitamin D per day.  If you are older than age 72, get at least 1,200 mg of calcium and at least 800 mg of vitamin D per day. Smoking  and excessive alcohol intake increase the risk of osteoporosis. Eat foods that are rich in calcium and vitamin D, and do weight-bearing exercises several times each week as directed by your health care provider. What should I know about how menopause affects my mental health? Depression may occur at any age, but it is more common as you become older. Common symptoms of depression include:  Low or sad mood.  Changes in sleep patterns.  Changes in appetite or eating patterns.  Feeling an overall lack of motivation or enjoyment of activities that you previously enjoyed.  Frequent crying spells. Talk with your health care provider if you think that you are experiencing depression. What should I know about immunizations? It is important that you get and maintain your immunizations. These include:  Tetanus, diphtheria, and pertussis (Tdap) booster vaccine.  Influenza  every year before the flu season begins.  Pneumonia vaccine.  Shingles vaccine. Your health care provider may also recommend other immunizations. This information is not intended to replace advice given to you by your health care provider. Make sure you discuss any questions you have with your health care provider. Document Released: 06/16/2005 Document Revised: 11/12/2015 Document Reviewed: 01/26/2015  2017 Elsevier

## 2016-04-18 NOTE — Addendum Note (Signed)
Addended by: Elouise Munroe on: 04/18/2016 12:53 PM   Modules accepted: Orders

## 2016-04-18 NOTE — Progress Notes (Signed)
ANNUAL PREVENTATIVE CARE GYN  ENCOUNTER NOTE  Subjective:       Tonya Guzman is a 55 y.o. [phone] female here for a routine annual gynecologic exam.  Current complaints: 1.  Lack of sex drive Vaginal dryness   Gynecologic History No LMP recorded. Patient is postmenopausal. Contraception: post menopausal status Last Pap: 2016 wnl- h/o of abn 2015. Results were:  Last mammogram: 2016 birad 2. Results were: normal  Obstetric History OB History  Gravida Para Term Preterm AB Living  3 3 1 2   2   SAB TAB Ectopic Multiple Live Births          2    # Outcome Date GA Lbr Len/2nd Weight Sex Delivery Anes PTL Lv  3 Preterm 1997   5 lb 2.2 oz (2.331 kg) M CS-LTranv   LIV     Complications: Placenta Previa  2 Preterm 1995     VBAC   FD  1 Term 1988   6 lb 2.1 oz (2.781 kg) F CS-LTranv   LIV      Past Medical History:  Diagnosis Date  . Fatigue   . Insomnia   . Night sweats     Past Surgical History:  Procedure Laterality Date  . CESAREAN SECTION    . FOOT SURGERY Right   . KIDNEY SURGERY      Current Outpatient Prescriptions on File Prior to Visit  Medication Sig Dispense Refill  . ALPRAZolam (XANAX) 0.5 MG tablet TAKE 1 TABLET BY MOUTH AT BEDTIME 30 tablet 5  . aspirin EC 81 MG tablet Take 81 mg by mouth daily.    Marland Kitchen ESTRACE VAGINAL 0.1 MG/GM vaginal cream     . Magnesium Chloride (MAGNESIUM DR PO) Take by mouth.    . naproxen (NAPROSYN) 500 MG tablet Take 1 tablet (500 mg total) by mouth 2 (two) times daily as needed. for pain 180 tablet 3  . NASONEX 50 MCG/ACT nasal spray Reported on 07/21/2015     No current facility-administered medications on file prior to visit.     No Known Allergies  Social History   Social History  . Marital status: Married    Spouse name: N/A  . Number of children: N/A  . Years of education: N/A   Occupational History  . Not on file.   Social History Main Topics  . Smoking status: Former Smoker    Packs/day: 0.25    Years: 1.00     Types: Cigarettes    Quit date: 05/08/2012  . Smokeless tobacco: Never Used  . Alcohol use Yes     Comment: 3 drinks a week  . Drug use: No  . Sexual activity: Yes   Other Topics Concern  . Not on file   Social History Narrative  . No narrative on file    Family History  Problem Relation Age of Onset  . Pneumonia Mother   . Hypertension Mother   . Hyperlipidemia Father   . Hypertension Father   . Heart disease Father   . Heart attack Father   . Hypertension Sister   . Hypertension Sister   . Hepatitis C Brother     The following portions of the patient's history were reviewed and updated as appropriate: allergies, current medications, past family history, past medical history, past social history, past surgical history and problem list.  Review of Systems ROS Review of Systems - General ROS: negative for - chills, fatigue, fever, hot flashes, night sweats, weight gain or  weight loss Psychological ROS: negative for - anxiety, decreased libido, depression, mood swings, physical abuse or sexual abuse Ophthalmic ROS: negative for - blurry vision, eye pain or loss of vision ENT ROS: negative for - headaches, hearing change, visual changes or vocal changes Allergy and Immunology ROS: negative for - hives, itchy/watery eyes or seasonal allergies Hematological and Lymphatic ROS: negative for - bleeding problems, bruising, swollen lymph nodes or weight loss Endocrine ROS: negative for - galactorrhea, hair pattern changes, hot flashes, malaise/lethargy, mood swings, palpitations, polydipsia/polyuria, skin changes, temperature intolerance or unexpected weight changes Breast ROS: negative for - new or changing breast lumps or nipple discharge Respiratory ROS: negative for - cough or shortness of breath Cardiovascular ROS: negative for - chest pain, irregular heartbeat, palpitations or shortness of breath Gastrointestinal ROS: no abdominal pain, change in bowel habits, or black or bloody  stools Genito-Urinary ROS: no dysuria, trouble voiding, or hematuria Musculoskeletal ROS: negative for - joint pain or joint stiffness Neurological ROS: negative for - bowel and bladder control changes Dermatological ROS: negative for rash and skin lesion changes   Objective:   BP 113/74   Pulse 76   Ht 5\' 6"  (1.676 m)   Wt 170 lb 12.8 oz (77.5 kg)   BMI 27.57 kg/m  CONSTITUTIONAL: Well-developed, well-nourished female in no acute distress.  PSYCHIATRIC: Normal mood and affect. Normal behavior. Normal judgment and thought content. [place]: Alert and oriented to person, place, and time. Normal muscle tone coordination. No cranial nerve deficit noted. HENT:  Normocephalic, atraumatic, External right and left ear normal. Oropharynx is clear and moist EYES: Conjunctivae and EOM are normal. Pupils are equal, round, and reactive to light. No scleral icterus.  NECK: Normal range of motion, supple, no masses.  Normal thyroid.  SKIN: Skin is warm and dry. No rash noted. Not diaphoretic. No erythema. No pallor. CARDIOVASCULAR: Normal heart rate noted, regular rhythm, no murmur. RESPIRATORY: Clear to auscultation bilaterally. Effort and breath sounds normal, no problems with respiration noted. BREASTS: Symmetric in size. No masses, skin changes, nipple drainage, or lymphadenopathy. ABDOMEN: Soft, normal bowel sounds, no distention noted.  No tenderness, rebound or guarding.  BLADDER: Normal PELVIC:  External Genitalia: Normal  BUS: Normal  Vagina: Mild to moderate atrophy  Cervix: Normal; no lesions; no cervical motion tenderness  Uterus: Normal; midplane, top normal size, mobile, nontender  Adnexa: Normal; nonpalpable and nontender  RV: External Exam NormaI, No Rectal Masses and Normal Sphincter tone  MUSCULOSKELETAL: Normal range of motion. No tenderness.  No cyanosis, clubbing, or edema.  2+ distal pulses. LYMPHATIC: No Axillary, Supraclavicular, or Inguinal  Adenopathy.    Assessment:   Annual gynecologic examination 55 y.o. Contraception: post menopausal status Normal BMI Vaginal dryness Dyspareunia  Plan:  Pap: Pap Co Test Mammogram: Ordered Stool Guaiac Testing:  Ordered Labs: thru pcp Routine preventative health maintenance measures emphasized: Exercise/Diet/Weight control, Tobacco Warnings and Alcohol/Substance use risks Trial of Intrarosa vaginal suppositories for vaginal dryness JO H2O lubricant recommended Return to Haivana Nakya, CMA  Brayton Mars, MD  Note: This dictation was prepared with Dragon dictation along with smaller phrase technology. Any transcriptional errors that result from this process are unintentional.

## 2016-04-21 LAB — PAP IG AND HPV HIGH-RISK
HPV, high-risk: NEGATIVE
PAP SMEAR COMMENT: 0

## 2016-05-03 ENCOUNTER — Telehealth: Payer: Self-pay | Admitting: Obstetrics and Gynecology

## 2016-05-03 MED ORDER — PRASTERONE 6.5 MG VA INST: 6.5000 mg | VAGINAL_INSERT | Freq: Every day | VAGINAL | 6 refills | Status: DC

## 2016-05-03 NOTE — Telephone Encounter (Signed)
Pt aware med erx per vm.

## 2016-05-03 NOTE — Telephone Encounter (Signed)
This pt wants the intrarosa sent to [address]

## 2016-05-05 LAB — FECAL OCCULT BLOOD, IMMUNOCHEMICAL: Fecal Occult Bld: NEGATIVE

## 2016-05-10 ENCOUNTER — Encounter: Payer: Self-pay | Admitting: Unknown Physician Specialty

## 2016-05-23 ENCOUNTER — Ambulatory Visit
Admission: RE | Admit: 2016-05-23 | Discharge: 2016-05-23 | Disposition: A | Discharge status: Home / Self Care | Payer: BLUE CROSS/BLUE SHIELD | Source: Ambulatory Visit | Attending: Obstetrics and Gynecology | Admitting: Obstetrics and Gynecology

## 2016-05-23 DIAGNOSIS — Z1239 Encounter for other screening for malignant neoplasm of breast: Secondary | ICD-10-CM

## 2016-05-23 DIAGNOSIS — Z1231 Encounter for screening mammogram for malignant neoplasm of breast: Secondary | ICD-10-CM | POA: Diagnosis not present

## 2016-07-27 ENCOUNTER — Other Ambulatory Visit: Payer: Self-pay | Admitting: Family Medicine

## 2016-07-27 DIAGNOSIS — F419 Anxiety disorder, unspecified: Secondary | ICD-10-CM

## 2016-08-23 ENCOUNTER — Ambulatory Visit (INDEPENDENT_AMBULATORY_CARE_PROVIDER_SITE_OTHER): Payer: BLUE CROSS/BLUE SHIELD | Admitting: Physician Assistant

## 2016-08-23 ENCOUNTER — Encounter: Payer: Self-pay | Admitting: Physician Assistant

## 2016-08-23 VITALS — BP 104/76 | HR 75 | Temp 98.0°F | Wt 173.0 lb

## 2016-08-23 DIAGNOSIS — R110 Nausea: Secondary | ICD-10-CM

## 2016-08-23 DIAGNOSIS — R1013 Epigastric pain: Secondary | ICD-10-CM | POA: Diagnosis not present

## 2016-08-23 NOTE — Progress Notes (Signed)
Patient: Tonya Guzman Female    DOB: 1961/02/01   56 y.o.   MRN: [id number] Visit Date: 08/23/2016  Today's Provider: Trinna Post, PA-C   Chief Complaint  Patient presents with  . Abdominal Pain   Subjective:    Abdominal Pain  This is a new problem. Episode onset: Monday night. The onset quality is sudden. The problem occurs intermittently. The pain is located in the epigastric region. The pain is at a severity of 8/10 (when having a episode). The quality of the pain is sharp. The abdominal pain does not radiate. Associated symptoms include constipation and nausea. Nothing aggravates the pain. The pain is relieved by nothing. Treatments tried: Gas X. The treatment provided no relief.   Tonya Guzman is a 56 y/o woman with remote history of smoking one cigarette daily who is presenting today with epigastric pain ongoing for two days. She does not remember inciting event. She describes it as sharp but intermittent. It can last several hours. It does not radiate anywhere. Some constipation but no diarrhea. No hematochezia or melena. Some nausea but no vomiting. No urinary symptoms. Able to eat, drink. Pain doesn't change in relation to eating. No fevers, chills. Three alcoholic drinks per week. No chest pain, SOB. TUMS provided no relief. She avoids things like spicy foods, onions. Does have 1 cup coffee per day.    Previous Medications   ALPRAZOLAM (XANAX) 0.5 MG TABLET    TAKE 1 TABLET BY MOUTH AT BEDTIME   ASPIRIN EC 81 MG TABLET    Take 81 mg by mouth daily.   ESTRADIOL (ESTRACE VAGINAL) 0.1 MG/GM VAGINAL CREAM    Place 5.63 Applicatorfuls vaginally 2 (two) times a week.   MAGNESIUM CHLORIDE (MAGNESIUM DR PO)    Take by mouth.   NAPROXEN (NAPROSYN) 500 MG TABLET    Take 1 tablet (500 mg total) by mouth 2 (two) times daily as needed. for pain   NASONEX 50 MCG/ACT NASAL SPRAY    Reported on 07/21/2015   PAZEO 0.7 % SOLN    INSTILL 1 DROP INTO BOTH EYES EVERY DAY   PRASTERONE (INTRAROSA)  6.5 MG INST    Place 6.5 mg vaginally daily.    Review of Systems  Constitutional: Negative.   Respiratory: Negative.   Cardiovascular: Negative.   Gastrointestinal: Positive for abdominal pain, constipation and nausea.    Social History  Substance Use Topics  . Smoking status: Former Smoker    Packs/day: 0.25    Years: 1.00    Types: Cigarettes    Quit date: 05/08/2012  . Smokeless tobacco: Never Used  . Alcohol use Yes     Comment: 3 drinks a week   Objective:   BP 104/76 (BP Location: Right Arm, Patient Position: Sitting, Cuff Size: Normal)   Pulse 75   Temp 98 F (36.7 C) (Oral)   Wt 173 lb (78.5 kg)   SpO2 98%   BMI 27.92 kg/m   Physical Exam  Constitutional: She appears well-developed and well-nourished.  Neck: Neck supple.  Cardiovascular: Normal rate and regular rhythm.   Pulmonary/Chest: Effort normal.  Abdominal: Soft. Bowel sounds are normal. She exhibits no distension and no mass. There is no tenderness. There is no rebound, no guarding and no CVA tenderness.  Skin: Skin is warm and dry.  Psychiatric: She has a normal mood and affect. Her behavior is normal.        Assessment & Plan:     1. Epigastric pain  Possibly acid reflux, will evaluate for pancreatitis and cholecystitis with below labs. If pain persists, will consider getting RUQ/abdominal ultrasound. Have advised patient to use Zantac 150 mg BID to cover for acid reflux.   - Comprehensive metabolic panel - Lipase - Amylase  2. Nausea  - Comprehensive metabolic panel - Lipase - Amylase  The entirety of the information documented in the History of Present Illness, Review of Systems and Physical Exam were personally obtained by me. Portions of this information were initially documented by Tiburcio Pea, CMA and reviewed by me for thoroughness and accuracy.     Follow up: Return if symptoms worsen or fail to improve.

## 2016-08-23 NOTE — Patient Instructions (Signed)
Please take Ranitidine 150 mg orally twice daily   Abdominal Pain, Adult Many things can cause belly (abdominal) pain. Most times, belly pain is not dangerous. Many cases of belly pain can be watched and treated at home. Sometimes belly pain is serious, though. Your doctor will try to find the cause of your belly pain. Follow these instructions at home:  Take over-the-counter and prescription medicines only as told by your doctor. Do not take medicines that help you poop (laxatives) unless told to by your doctor.  Drink enough fluid to keep your pee (urine) clear or pale yellow.  Watch your belly pain for any changes.  Keep all follow-up visits as told by your doctor. This is important. Contact a doctor if:  Your belly pain changes or gets worse.  You are not hungry, or you lose weight without trying.  You are having trouble pooping (constipated) or have watery poop (diarrhea) for more than 2-3 days.  You have pain when you pee or poop.  Your belly pain wakes you up at night.  Your pain gets worse with meals, after eating, or with certain foods.  You are throwing up and cannot keep anything down.  You have a fever. Get help right away if:  Your pain does not go away as soon as your doctor says it should.  You cannot stop throwing up.  Your pain is only in areas of your belly, such as the right side or the left lower part of the belly.  You have bloody or black poop, or poop that looks like tar.  You have very bad pain, cramping, or bloating in your belly.  You have signs of not having enough fluid or water in your body (dehydration), such as:  Dark pee, very little pee, or no pee.  Cracked lips.  Dry mouth.  Sunken eyes.  Sleepiness.  Weakness. This information is not intended to replace advice given to you by your health care provider. Make sure you discuss any questions you have with your health care provider. Document Released: 10/11/2007 Document Revised:  11/12/2015 Document Reviewed: 10/06/2015 Elsevier Interactive Patient Education  2017 [employer].

## 2016-08-24 LAB — COMPREHENSIVE METABOLIC PANEL
ALT: 22 IU/L (ref 0–32)
AST: 20 IU/L (ref 0–40)
Albumin/Globulin Ratio: 1.8 (ref 1.2–2.2)
Albumin: 4.6 g/dL (ref 3.5–5.5)
Alkaline Phosphatase: 69 IU/L (ref 39–117)
BUN/Creatinine Ratio: 18 (ref 9–23)
BUN: 14 mg/dL (ref 6–24)
Bilirubin Total: 0.5 mg/dL (ref 0.0–1.2)
CO2: 25 mmol/L (ref 18–29)
Calcium: 9.7 mg/dL (ref 8.7–10.2)
Chloride: 105 mmol/L (ref 96–106)
Creatinine, Ser: 0.79 mg/dL (ref 0.57–1.00)
GFR calc Af Amer: 96 mL/min/{1.73_m2} (ref >59)
GFR calc non Af Amer: 83 mL/min/{1.73_m2} (ref >59)
Globulin, Total: 2.5 g/dL (ref 1.5–4.5)
Glucose: 108 mg/dL — ABNORMAL HIGH (ref 65–99)
Potassium: 4.4 mmol/L (ref 3.5–5.2)
Sodium: 143 mmol/L (ref 134–144)
Total Protein: 7.1 g/dL (ref 6.0–8.5)

## 2016-08-24 LAB — AMYLASE: Amylase: 35 U/L (ref 31–124)

## 2016-08-24 LAB — LIPASE: Lipase: 23 U/L (ref 14–72)

## 2016-08-25 ENCOUNTER — Telehealth: Payer: Self-pay

## 2016-08-25 NOTE — Telephone Encounter (Signed)
-----   Message from Trinna Post, [place] sent at 08/24/2016  8:25 AM EDT ----- Labs are normal. No sign of obstruction of gallbladder or pancreas, normal liver function. Would suggest patient continue taking 150 mg Zantac BID for a couple weeks to see if this helps symptoms and call us back if not improving or worsening.

## 2016-08-25 NOTE — Telephone Encounter (Signed)
Pt advised.  She reports the abdominal pain comes and goes, but feels better overall.  I advised her to continue with the Zantac and also try and keep a food diary to see if she can notice any trigger food.  She will call back if she does not continue to improve.  Thanks,   -Mickel Baas

## 2016-11-28 ENCOUNTER — Telehealth: Payer: Self-pay | Admitting: Obstetrics and Gynecology

## 2016-11-28 NOTE — Telephone Encounter (Signed)
Patient called to ask if Dr D offers the procedure called Tonya Guzman??  Please let her know

## 2016-11-30 NOTE — Telephone Encounter (Signed)
Pt aware. She has a consultation scheduled.  Asked pt to f/u and let us know how she is doing.

## 2017-01-30 ENCOUNTER — Encounter: Payer: Self-pay | Admitting: Family Medicine

## 2017-01-30 ENCOUNTER — Ambulatory Visit (INDEPENDENT_AMBULATORY_CARE_PROVIDER_SITE_OTHER): Payer: BLUE CROSS/BLUE SHIELD | Admitting: Family Medicine

## 2017-01-30 VITALS — BP 108/72 | HR 72 | Temp 97.7°F | Resp 16 | Ht 66.0 in | Wt 172.0 lb

## 2017-01-30 DIAGNOSIS — F419 Anxiety disorder, unspecified: Secondary | ICD-10-CM | POA: Diagnosis not present

## 2017-01-30 DIAGNOSIS — Z0000 Encounter for general adult medical examination without abnormal findings: Secondary | ICD-10-CM

## 2017-01-30 LAB — LIPID PANEL
Cholesterol: 271 mg/dL — ABNORMAL HIGH (ref <200)
HDL: 75 mg/dL (ref >50)
LDL CHOLESTEROL (CALC): 169 mg/dL — AB
Non-HDL Cholesterol (Calc): 196 mg/dL (calc) — ABNORMAL HIGH (ref <130)
TRIGLYCERIDES: 134 mg/dL (ref <150)
Total CHOL/HDL Ratio: 3.6 (calc) (ref <5.0)

## 2017-01-30 LAB — COMPREHENSIVE METABOLIC PANEL
AG Ratio: 1.8 (calc) (ref 1.0–2.5)
ALBUMIN MSPROF: 4.4 g/dL (ref 3.6–5.1)
ALT: 18 U/L (ref 6–29)
AST: 18 U/L (ref 10–35)
Alkaline phosphatase (APISO): 60 U/L (ref 33–130)
BILIRUBIN TOTAL: 0.6 mg/dL (ref 0.2–1.2)
BUN: 13 mg/dL (ref 7–25)
CO2: 25 mmol/L (ref 20–32)
CREATININE: 0.88 mg/dL (ref 0.50–1.05)
Calcium: 9.3 mg/dL (ref 8.6–10.4)
Chloride: 106 mmol/L (ref 98–110)
GLUCOSE: 113 mg/dL — AB (ref 65–99)
Globulin: 2.5 g/dL (calc) (ref 1.9–3.7)
POTASSIUM: 4.5 mmol/L (ref 3.5–5.3)
SODIUM: 139 mmol/L (ref 135–146)
TOTAL PROTEIN: 6.9 g/dL (ref 6.1–8.1)

## 2017-01-30 LAB — POCT URINALYSIS DIPSTICK
Bilirubin, UA: NEGATIVE
Blood, UA: NEGATIVE
GLUCOSE UA: NEGATIVE
Ketones, UA: NEGATIVE
Leukocytes, UA: NEGATIVE
NITRITE UA: NEGATIVE
PROTEIN UA: NEGATIVE
Spec Grav, UA: 1.01 (ref 1.010–1.025)
UROBILINOGEN UA: 0.2 U/dL
pH, UA: 6 (ref 5.0–8.0)

## 2017-01-30 LAB — CBC WITH DIFFERENTIAL/PLATELET
BASOS PCT: 1.2 %
Basophils Absolute: 62 cells/uL (ref 0–200)
EOS PCT: 2.7 %
Eosinophils Absolute: 140 cells/uL (ref 15–500)
HEMATOCRIT: 39.1 % (ref 35.0–45.0)
Hemoglobin: 13.4 g/dL (ref 11.7–15.5)
LYMPHS ABS: 1477 {cells}/uL (ref 850–3900)
MCH: 31.5 pg (ref 27.0–33.0)
MCHC: 34.3 g/dL (ref 32.0–36.0)
MCV: 91.8 fL (ref 80.0–100.0)
MPV: 11 fL (ref 7.5–12.5)
Monocytes Relative: 9.6 %
Neutro Abs: 3021 cells/uL (ref 1500–7800)
Neutrophils Relative %: 58.1 %
Platelets: 266 10*3/uL (ref 140–400)
RBC: 4.26 10*6/uL (ref 3.80–5.10)
RDW: 13.1 % (ref 11.0–15.0)
TOTAL LYMPHOCYTE: 28.4 %
WBC: 5.2 10*3/uL (ref 3.8–10.8)
WBCMIX: 499 {cells}/uL (ref 200–950)

## 2017-01-30 LAB — TSH: TSH: 2.82 mIU/L (ref 0.40–4.50)

## 2017-01-30 MED ORDER — ALPRAZOLAM 0.5 MG PO TABS: 0.5000 mg | ORAL_TABLET | Freq: Every day | ORAL | 5 refills | Status: DC

## 2017-01-30 NOTE — Progress Notes (Signed)
Patient: Tonya Guzman, Female    DOB: 1960-09-27, 56 y.o.   MRN: [id number] Visit Date: 01/30/2017  Today's Provider: Wilhemena Durie, MD   Chief Complaint  Patient presents with  . Annual Exam   Subjective:    Annual physical exam Tonya Guzman is a 56 y.o. female who presents today for health maintenance and complete physical. She feels well. She reports exercising but not as much as she should be. She reports she is sleeping poorly, she has difficulty falling asleep and staying asleep. .  patient just lost her job 2 weeks ago. GYN exam per Dr. Enzo Bi.  ----------------------------------------------------------------- Colonoscopy- 01/05/11 internal hemorrhoids, 1 polyps submucosal lipoma otherwise normal.  mammogram- 05/23/16 Pap- 04/18/16 (Gyn) normal  Immunization History  Administered Date(s) Administered  . Tdap 03/13/2007     Review of Systems  Constitutional: Negative.   HENT: Negative.   Eyes: Negative.   Respiratory: Positive for apnea.   Cardiovascular: Negative.   Gastrointestinal: Positive for constipation.  Endocrine: Negative.   Genitourinary: Negative.   Musculoskeletal: Positive for arthralgias and back pain.  Skin: Negative.   Allergic/Immunologic: Negative.   Neurological: Negative.   Hematological: Negative.   Psychiatric/Behavioral: Positive for sleep disturbance.    Social History      She  reports that she quit smoking about 4 years ago. Her smoking use included Cigarettes. She has a 0.25 pack-year smoking history. She has never used smokeless tobacco. She reports that she drinks alcohol. She reports that she does not use drugs.       Social History   Social History  . Marital status: Married    Spouse name: N/A  . Number of children: N/A  . Years of education: N/A   Social History Main Topics  . Smoking status: Former Smoker    Packs/day: 0.25    Years: 1.00    Types: Cigarettes    Quit date: 05/08/2012  . Smokeless  tobacco: Never Used  . Alcohol use Yes     Comment: 3 drinks a week  . Drug use: No  . Sexual activity: Yes    Birth control/ protection: Post-menopausal   Other Topics Concern  . None   Social History Narrative  . None    Past Medical History:  Diagnosis Date  . Fatigue   . Insomnia   . Night sweats      Patient Active Problem List   Diagnosis Date Noted  . Menopausal vaginal dryness 04/18/2016  . Menopause 04/18/2016  . Anxiety 01/26/2016    Past Surgical History:  Procedure Laterality Date  . CESAREAN SECTION    . FOOT SURGERY Right   . KIDNEY SURGERY      Family History        Family Status  Relation Status  . Mother Alive  . Father Deceased at age 50  . Sister Alive  . Brother Alive  . Sister Alive  . Brother Deceased  . Neg Hx (Not Specified)        Her family history includes Heart attack in her father; Heart disease in her father; Hepatitis C in her brother; Hyperlipidemia in her father; Hypertension in her father, mother, sister, and sister; Pneumonia in her mother.     No Known Allergies   Current Outpatient Prescriptions:  .  ALPRAZolam (XANAX) 0.5 MG tablet, TAKE 1 TABLET BY MOUTH AT BEDTIME, Disp: 30 tablet, Rfl: 4 .  aspirin EC 81 MG tablet, Take 81 mg  by mouth daily., Disp: , Rfl:  .  estradiol (ESTRACE VAGINAL) 0.1 MG/GM vaginal cream, Place 3.82 Applicatorfuls vaginally 2 (two) times a week., Disp: 42.5 g, Rfl: 1 .  Magnesium Chloride (MAGNESIUM DR PO), Take by mouth., Disp: , Rfl:  .  naproxen (NAPROSYN) 500 MG tablet, Take 1 tablet (500 mg total) by mouth 2 (two) times daily as needed. for pain, Disp: 180 tablet, Rfl: 3 .  NASONEX 50 MCG/ACT nasal spray, Reported on 07/21/2015, Disp: , Rfl:  .  PAZEO 0.7 % SOLN, INSTILL 1 DROP INTO BOTH EYES EVERY DAY, Disp: , Rfl: 5 .  Prasterone (INTRAROSA) 6.5 MG INST, Place 6.5 mg vaginally daily., Disp: 30 each, Rfl: 6   Patient Care Team: Jerrol Banana., MD as PCP - General (Family  Medicine)      Objective:   Vitals: BP 108/72 (BP Location: Left Arm, Patient Position: Sitting, Cuff Size: Normal)   Pulse 72   Temp 97.7 F (36.5 C) (Oral)   Resp 16   Ht 5\' 6"  (1.676 m)   Wt 172 lb (78 kg)   BMI 27.76 kg/m    Vitals:   01/30/17 0831  BP: 108/72  Pulse: 72  Resp: 16  Temp: 97.7 F (36.5 C)  TempSrc: Oral  Weight: 172 lb (78 kg)  Height: 5\' 6"  (1.676 m)     Physical Exam  Constitutional: She is oriented to person, place, and time. She appears well-developed and well-nourished.  HENT:  Head: Normocephalic and atraumatic.  Right Ear: External ear normal.  Left Ear: External ear normal.  Nose: Nose normal.  Mouth/Throat: Oropharynx is clear and moist.  Eyes: Pupils are equal, round, and reactive to light. Conjunctivae and EOM are normal.  Neck: Normal range of motion. Neck supple.  Cardiovascular: Normal rate, regular rhythm, normal heart sounds and intact distal pulses.   Pulmonary/Chest: Effort normal and breath sounds normal.  Abdominal: Soft. Bowel sounds are normal.  Musculoskeletal: Normal range of motion.  Neurological: She is alert and oriented to person, place, and time. She has normal reflexes.  Skin: Skin is warm and dry.  Psychiatric: She has a normal mood and affect. Her behavior is normal. Judgment and thought content normal.     Depression Screen PHQ 2/9 Scores 01/30/2017 01/26/2016 12/30/2014  PHQ - 2 Score 2 0 0  PHQ- 9 Score 7 - -      Assessment & Plan:     Routine Health Maintenance and Physical Exam  Exercise Activities and Dietary recommendations Goals    None      Immunization History  Administered Date(s) Administered  . Tdap 03/13/2007    Health Maintenance  Topic Date Due  . HIV Screening  04/18/1974  . INFLUENZA VACCINE  05/01/2017 (Originally 12/06/2016)  . TETANUS/TDAP  03/12/2017  . MAMMOGRAM  05/23/2018  . PAP SMEAR  04/19/2019  . COLONOSCOPY  01/04/2021  . Hepatitis C Screening  Completed      Discussed health benefits of physical activity, and encouraged her to engage in regular exercise appropriate for her age and condition.  Breast/Pelvic/DRE per Gyn.   --------------------------------------------------------------------   I have done the exam and reviewed the chart and it is accurate to the best of my knowledge. Development worker, community has been used and  any errors in dictation or transcription are unintentional. Miguel Aschoff M.D. [place], MD  [facility]

## 2017-02-02 ENCOUNTER — Telehealth: Payer: Self-pay

## 2017-02-02 NOTE — Telephone Encounter (Signed)
-----   Message from Jerrol Banana., MD sent at 02/02/2017 12:35 PM EDT ----- Diet and exercise for high cholesterol. Consider one dose of Metamucil daily. Return to clinic 6 months

## 2017-02-02 NOTE — Telephone Encounter (Signed)
Patient advised.KW 

## 2017-02-05 ENCOUNTER — Encounter: Payer: Self-pay | Admitting: Family Medicine

## 2017-04-24 ENCOUNTER — Encounter: Payer: BLUE CROSS/BLUE SHIELD | Admitting: Obstetrics and Gynecology

## 2017-07-12 ENCOUNTER — Encounter: Payer: Self-pay | Admitting: Obstetrics and Gynecology

## 2017-08-07 ENCOUNTER — Encounter: Payer: Self-pay | Admitting: Family Medicine

## 2017-08-07 ENCOUNTER — Ambulatory Visit (INDEPENDENT_AMBULATORY_CARE_PROVIDER_SITE_OTHER): Payer: 59 | Admitting: Family Medicine

## 2017-08-07 VITALS — BP 114/64 | HR 72 | Temp 97.5°F | Resp 16 | Wt 176.0 lb

## 2017-08-07 DIAGNOSIS — B029 Zoster without complications: Secondary | ICD-10-CM | POA: Diagnosis not present

## 2017-08-07 NOTE — Progress Notes (Signed)
Patient: Tonya Guzman Female    DOB: [birth year]   57 y.o.   MRN: [id number] Visit Date: 08/07/2017  Today's Provider: Wilhemena Durie, MD   Chief Complaint  Patient presents with  . Rash    Left side of face.  Pt is concern that she has shingles.    Subjective:    Rash  This is a new problem. The current episode started in the past 7 days. The problem has been gradually worsening since onset. The affected locations include the face. The rash is characterized by blistering, burning, pain and itchiness. She was exposed to nothing. Pertinent negatives include no anorexia, congestion, cough, diarrhea, eye pain, facial edema, fatigue, fever, joint pain, nail changes, rhinorrhea, shortness of breath, sore throat or vomiting.      No Known Allergies   Current Outpatient Medications:  .  ALPRAZolam (XANAX) 0.5 MG tablet, Take 1 tablet (0.5 mg total) by mouth at bedtime., Disp: 30 tablet, Rfl: 5 .  aspirin EC 81 MG tablet, Take 81 mg by mouth daily., Disp: , Rfl:  .  estradiol (ESTRACE VAGINAL) 0.1 MG/GM vaginal cream, Place 9.62 Applicatorfuls vaginally 2 (two) times a week., Disp: 42.5 g, Rfl: 1 .  Magnesium Chloride (MAGNESIUM DR PO), Take by mouth., Disp: , Rfl:  .  naproxen (NAPROSYN) 500 MG tablet, Take 1 tablet (500 mg total) by mouth 2 (two) times daily as needed. for pain, Disp: 180 tablet, Rfl: 3 .  NASONEX 50 MCG/ACT nasal spray, Reported on 07/21/2015, Disp: , Rfl:  .  PAZEO 0.7 % SOLN, INSTILL 1 DROP INTO BOTH EYES EVERY DAY, Disp: , Rfl: 5 .  Prasterone (INTRAROSA) 6.5 MG INST, Place 6.5 mg vaginally daily., Disp: 30 each, Rfl: 6  Review of Systems  Constitutional: Negative.  Negative for fatigue and fever.  HENT: Negative for congestion, rhinorrhea and sore throat.   Eyes: Negative.  Negative for pain.  Respiratory: Negative for cough and shortness of breath.   Gastrointestinal: Negative for anorexia, diarrhea and vomiting.  Musculoskeletal: Negative for joint  pain.  Skin: Positive for rash. Negative for nail changes.    Social History   Tobacco Use  . Smoking status: Former Smoker    Packs/day: 0.25    Years: 1.00    Pack years: 0.25    Types: Cigarettes    Last attempt to quit: 05/08/2012    Years since quitting: 5.2  . Smokeless tobacco: Never Used  Substance Use Topics  . Alcohol use: Yes    Comment: 3 drinks a week   Objective:   BP 114/64 (BP Location: Right Arm, Patient Position: Sitting, Cuff Size: Normal)   Pulse 72   Temp (!) 97.5 F (36.4 C) (Oral)   Resp 16   Wt 176 lb (79.8 kg)   BMI 28.41 kg/m  Vitals:   08/07/17 1520  BP: 114/64  Pulse: 72  Resp: 16  Temp: (!) 97.5 F (36.4 C)  TempSrc: Oral  Weight: 176 lb (79.8 kg)     Physical Exam  Constitutional: She is oriented to person, place, and time. She appears well-developed and well-nourished.  HENT:  Head: Normocephalic and atraumatic.  Eyes: Pupils are equal, round, and reactive to light. Conjunctivae and EOM are normal.  Abdominal: Soft.  Neurological: She is alert and oriented to person, place, and time. She has normal reflexes.  Skin: Rash noted.  Mild rash of left forehead c/w early shingles. V1 distribution.  Psychiatric: She has  a normal mood and affect. Her behavior is normal. Judgment and thought content normal.        Assessment & Plan:     Shingles Too late to Rx with antiviral. Not painful. No eye involvement. Shingrix in 1 year as this is second shingles attack this pt has had--same distribution per pt.       Richard Cranford Mon, MD  [facility] .

## 2017-08-13 NOTE — Progress Notes (Signed)
ANNUAL PREVENTATIVE CARE GYN  ENCOUNTER NOTE  Subjective:       Tonya Guzman is a 57 y.o. [phone] female here for a routine annual gynecologic exam.  Current complaints:  1. Not using estrace cream; previous trial of Intrarosa was ineffective; Estrace cream is too messy- wants to try Osphena  No major interval health issues. Bowel function is normal. Bladder function is normal. Vasomotor symptoms are minimal.  She is not taking calcium and vitamin D   Gynecologic History No LMP recorded. Patient is postmenopausal. Contraception: post menopausal status Last Pap: 04/18/2016 neg/neg. Results were:  Last mammogram: 05/27/2016 birad 1. Results were: normal  Obstetric History OB History  Gravida Para Term Preterm AB Living  3 3 1 2   2   SAB TAB Ectopic Multiple Live Births          2    # Outcome Date GA Lbr Len/2nd Weight Sex Delivery Anes PTL Lv  3 Preterm 1997   5 lb 2.2 oz (2.331 kg) M CS-LTranv   LIV     Complications: Placenta Previa  2 Preterm 1995     VBAC   FD  1 Term 1988   6 lb 2.1 oz (2.781 kg) F CS-LTranv   LIV    Past Medical History:  Diagnosis Date  . Fatigue   . Insomnia   . Night sweats     Past Surgical History:  Procedure Laterality Date  . CESAREAN SECTION    . FOOT SURGERY Right   . KIDNEY SURGERY      Current Outpatient Medications on File Prior to Visit  Medication Sig Dispense Refill  . ALPRAZolam (XANAX) 0.5 MG tablet Take 1 tablet (0.5 mg total) by mouth at bedtime. 30 tablet 5  . aspirin EC 81 MG tablet Take 81 mg by mouth daily.    Marland Kitchen estradiol (ESTRACE VAGINAL) 0.1 MG/GM vaginal cream Place 1.44 Applicatorfuls vaginally 2 (two) times a week. 42.5 g 1  . Magnesium Chloride (MAGNESIUM DR PO) Take by mouth.    . naproxen (NAPROSYN) 500 MG tablet Take 1 tablet (500 mg total) by mouth 2 (two) times daily as needed. for pain 180 tablet 3  . NASONEX 50 MCG/ACT nasal spray Reported on 07/21/2015    . PAZEO 0.7 % SOLN INSTILL 1 DROP INTO BOTH EYES  EVERY DAY  5  . Prasterone (INTRAROSA) 6.5 MG INST Place 6.5 mg vaginally daily. 30 each 6   No current facility-administered medications on file prior to visit.     No Known Allergies  Social History   Socioeconomic History  . Marital status: Married    Spouse name: Not on file  . Number of children: Not on file  . Years of education: Not on file  . Highest education level: Not on file  Occupational History  . Not on file  Social Needs  . Financial resource strain: Not on file  . Food insecurity:    Worry: Not on file    Inability: Not on file  . Transportation needs:    Medical: Not on file    Non-medical: Not on file  Tobacco Use  . Smoking status: Former Smoker    Packs/day: 0.25    Years: 1.00    Pack years: 0.25    Types: Cigarettes    Last attempt to quit: 05/08/2012    Years since quitting: 5.2  . Smokeless tobacco: Never Used  Substance and Sexual Activity  . Alcohol use: Yes  Comment: 3 drinks a week  . Drug use: No  . Sexual activity: Yes    Birth control/protection: Post-menopausal  Lifestyle  . Physical activity:    Days per week: Not on file    Minutes per session: Not on file  . Stress: Not on file  Relationships  . Social connections:    Talks on phone: Not on file    Gets together: Not on file    Attends religious service: Not on file    Active member of club or organization: Not on file    Attends meetings of clubs or organizations: Not on file    Relationship status: Not on file  . Intimate partner violence:    Fear of current or ex partner: Not on file    Emotionally abused: Not on file    Physically abused: Not on file    Forced sexual activity: Not on file  Other Topics Concern  . Not on file  Social History Narrative  . Not on file    Family History  Problem Relation Age of Onset  . Pneumonia Mother   . Hypertension Mother   . Hyperlipidemia Father   . Hypertension Father   . Heart disease Father   . Heart attack Father    . Hypertension Sister   . Hypertension Sister   . Hepatitis C Brother   . Breast cancer Neg Hx   . Ovarian cancer Neg Hx   . Colon cancer Neg Hx   . Diabetes Neg Hx     The following portions of the patient's history were reviewed and updated as appropriate: allergies, current medications, past family history, past medical history, past social history, past surgical history and problem list.  Review of Systems Review of Systems  Constitutional:       Mild hot flashes  HENT: Negative.   Eyes: Negative.   Respiratory: Negative.   Cardiovascular: Negative.   Gastrointestinal: Negative.   Genitourinary: Negative.        Vaginal dryness and dyspareunia persist  Musculoskeletal: Negative.   Skin: Negative.   Neurological: Negative.   Endo/Heme/Allergies: Negative.   Psychiatric/Behavioral: Negative.      Objective:  BP 101/70   Pulse 88   Ht 5\' 6"  (1.676 m)   Wt 170 lb 1.6 oz (77.2 kg)   BMI 27.45 kg/m   CONSTITUTIONAL: Well-developed, well-nourished female in no acute distress.  PSYCHIATRIC: Normal mood and affect. Normal behavior. Normal judgment and thought content. [place]: Alert and oriented to person, place, and time. Normal muscle tone coordination. No cranial nerve deficit noted. HENT:  Normocephalic, atraumatic, External right and left ear normal.  EYES: Conjunctivae and EOM are normal.  No scleral icterus.  NECK: Normal range of motion, supple, no masses.  Normal thyroid.  SKIN: Skin is warm and dry. No rash noted. Not diaphoretic. No erythema. No pallor. CARDIOVASCULAR: Normal heart rate noted, regular rhythm, no murmur. RESPIRATORY: Clear to auscultation bilaterally. Effort and breath sounds normal, no problems with respiration noted. BREASTS: Symmetric in size. No masses, skin changes, nipple drainage, or lymphadenopathy. ABDOMEN: Soft, normal bowel sounds, no distention noted.  No tenderness, rebound or guarding.  BLADDER: Normal PELVIC:  External  Genitalia: Normal  BUS: Normal  Vagina: Moderate atrophy  Cervix: Normal; no lesions; no cervical motion tenderness  Uterus: Normal; midplane, top normal size, mobile, nontender  Adnexa: Normal; nonpalpable and nontender  RV: External Exam NormaI, No Rectal Masses and Normal Sphincter tone  MUSCULOSKELETAL: Normal range of motion.  No tenderness.  No cyanosis, clubbing, or edema.  2+ distal pulses. LYMPHATIC: No Axillary, Supraclavicular, or Inguinal Adenopathy.    Assessment:   Annual gynecologic examination 57 y.o. Contraception: post menopausal status BMI- 27 Menopause Vaginal dryness Dyspareunia  Plan:  Pap: Due 2020 Mammogram: Ordered Stool Guaiac Testing:  Ordered Labs: thru pcp Routine preventative health maintenance measures emphasized: Exercise/Diet/Weight control, Tobacco Warnings and Alcohol/Substance use risks JO H2O lubricant recommended Trial of Osphena for vaginal dryness-60 mg daily Calcium 1200 mg daily and vitamin D 800 international units daily Return to [place], CMA  Brayton Mars, MD   Note: This dictation was prepared with Dragon dictation along with smaller phrase technology. Any transcriptional errors that result from this process are unintentional.

## 2017-08-14 ENCOUNTER — Other Ambulatory Visit: Payer: Self-pay | Admitting: Family Medicine

## 2017-08-14 DIAGNOSIS — F419 Anxiety disorder, unspecified: Secondary | ICD-10-CM

## 2017-08-16 ENCOUNTER — Encounter: Payer: Self-pay | Admitting: Obstetrics and Gynecology

## 2017-08-16 ENCOUNTER — Ambulatory Visit (INDEPENDENT_AMBULATORY_CARE_PROVIDER_SITE_OTHER): Payer: 59 | Admitting: Obstetrics and Gynecology

## 2017-08-16 VITALS — BP 101/70 | HR 88 | Ht 66.0 in | Wt 170.1 lb

## 2017-08-16 DIAGNOSIS — Z1211 Encounter for screening for malignant neoplasm of colon: Secondary | ICD-10-CM | POA: Diagnosis not present

## 2017-08-16 DIAGNOSIS — Z01419 Encounter for gynecological examination (general) (routine) without abnormal findings: Secondary | ICD-10-CM

## 2017-08-16 DIAGNOSIS — Z780 Asymptomatic menopausal state: Secondary | ICD-10-CM | POA: Diagnosis not present

## 2017-08-16 DIAGNOSIS — N951 Menopausal and female climacteric states: Secondary | ICD-10-CM

## 2017-08-16 DIAGNOSIS — Z1239 Encounter for other screening for malignant neoplasm of breast: Secondary | ICD-10-CM

## 2017-08-16 DIAGNOSIS — Z1231 Encounter for screening mammogram for malignant neoplasm of breast: Secondary | ICD-10-CM

## 2017-08-16 MED ORDER — OSPEMIFENE 60 MG PO TABS: 60.0000 mg | ORAL_TABLET | Freq: Every day | ORAL | 11 refills | Status: DC

## 2017-08-16 NOTE — Patient Instructions (Signed)
1.  Pap smear is not done.  Next Pap smear is due in 2020 2.  Mammogram is ordered 3.  Stool guaiac card testing is ordered for colon cancer screening 4.  Screening labs are to be obtained through primary care 5.  Continue with healthy eating and exercise 6.  Continue using lubricants for vaginal lubrication during intercourse 7.  Return in 1 year for annual exam 8.  Trial of Osphena daily for vaginal dryness  Ospemifene oral tablets What is this medicine? OSPEMIFENE (os PEM i feen) is used to treat painful sexual intercourse in females after menopause, a symptom of menopause that occurs due to changes in and around the vagina. This medicine may be used for other purposes; ask your health care provider or pharmacist if you have questions. COMMON BRAND NAME(S): Osphena What should I tell my health care provider before I take this medicine? They need to know if you have any of these conditions: -cancer, such as breast, uterine, or other cancer -heart disease -history of blood clots -history of stroke -history of vaginal bleeding -liver disease -premenopausal -smoke tobacco -an unusual or allergic reaction to ospemifene, other medicines, foods, dyes, or preservatives -pregnant or trying to get pregnant -breast-feeding How should I use this medicine? Take this medicine by mouth with a glass of water. Take this medicine with food. Follow the directions on the prescription label. Do not take your medicine more often than directed. Talk to your pediatrician regarding the use of this medicine in children. Special care may be needed. Overdosage: If you think you have taken too much of this medicine contact a poison control center or emergency room at once. NOTE: This medicine is only for you. Do not share this medicine with others. What if I miss a dose? If you miss a dose, take it as soon as you can. If it is almost time for your next dose, take only that dose. Do not take double or extra  doses. What may interact with this medicine? -doxycycline -estrogens -fluconazole -furosemide -glyburide -ketoconazole -phenytoin -rifampin -warfarin This list may not describe all possible interactions. Give your health care provider a list of all the medicines, herbs, non-prescription drugs, or dietary supplements you use. Also tell them if you smoke, drink alcohol, or use illegal drugs. Some items may interact with your medicine. What should I watch for while using this medicine? Visit your health care professional for regular checks on your progress. You will need a regular breast and pelvic exam and Pap smear while on this medicine. You should also discuss the need for regular mammograms with your health care professional, and follow his or her guidelines for these tests. Also, periodically discuss the need to continue taking this medicine. Taking this medicine for [place] periods of time may increase your risk for serious side effects. This medicine can increase the risk of developing a condition (endometrial hyperplasia) that may lead to cancer of the lining of the uterus. Taking progestins, another hormone drug, with this medicine lowers the risk of developing this condition. Therefore, if your uterus has not been removed (by a hysterectomy), your doctor may prescribe a progestin for you to take together with your estrogen. You should know, however, that taking estrogens with progestins may have additional health risks. You should discuss the use of estrogens and progestins with your health care professional to determine the benefits and risks for you. This medicine can rarely cause blood clots. You should avoid [place] periods of bed rest while  taking this medicine. If you are going to have surgery, tell your doctor or health care professional that you are taking this medicine. This medicine should be stopped at least 4-6 weeks before surgery. After surgery, it should be restarted only after you  are walking again. It should not be restarted while you still need [place] periods of bed rest. You should not smoke while taking this medicine. Smoking may also increase your risk of blood clots. Smoking can also decrease the effects of this medicine. This medicine does not prevent hot flashes. It may cause hot flashes in some patients. If you have any reason to think you are pregnant; stop taking this medicine at once and contact your doctor or health care professional. What side effects may I notice from receiving this medicine? Side effects that you should report to your doctor or health care professional as soon as possible: -breathing problems -changes in vision -confusion, trouble speaking or understanding -new breast lumps -pain, swelling, warmth in the leg -pelvic pain or pressure -severe headaches -sudden chest pain -sudden numbness or weakness of the face, arm or leg -trouble walking, dizziness, loss of balance or coordination -unusual vaginal bleeding patterns -vaginal discharge that is bloody or brown Side effects that usually do not require medical attention (report to your doctor or health care professional if they continue or are bothersome): -hot flushes or flashes -increased sweating -muscle cramps -vaginal discharge (white or clear) This list may not describe all possible side effects. Call your doctor for medical advice about side effects. You may report side effects to FDA at 1-800-FDA-1088. Where should I keep my medicine? Keep out of the reach of children. Store at room temperature between 20 and 25 degrees C (68 and 77 degrees F). Protect from light. Keep container tightly closed. Throw away any unused medicine after the expiration date. NOTE: This sheet is a summary. It may not cover all possible information. If you have questions about this medicine, talk to your doctor, pharmacist, or health care provider.  2018 Elsevier/Gold Standard (2015-05-27  10:08:00)    Health Maintenance for Postmenopausal Women Menopause is a normal process in which your reproductive ability comes to an end. This process happens gradually over a span of months to years, usually between the ages of 28 and 47. Menopause is complete when you have missed 12 consecutive menstrual periods. It is important to talk with your health care provider about some of the most common conditions that affect postmenopausal women, such as heart disease, cancer, and bone loss (osteoporosis). Adopting a healthy lifestyle and getting preventive care can help to promote your health and wellness. Those actions can also lower your chances of developing some of these common conditions. What should I know about menopause? During menopause, you may experience a number of symptoms, such as:  Moderate-to-severe hot flashes.  Night sweats.  Decrease in sex drive.  Mood swings.  Headaches.  Tiredness.  Irritability.  Memory problems.  Insomnia.  Choosing to treat or not to treat menopausal changes is an individual decision that you make with your health care provider. What should I know about hormone replacement therapy and supplements? Hormone therapy products are effective for treating symptoms that are associated with menopause, such as hot flashes and night sweats. Hormone replacement carries certain risks, especially as you become older. If you are thinking about using estrogen or estrogen with progestin treatments, discuss the benefits and risks with your health care provider. What should I know about heart disease and  stroke? Heart disease, heart attack, and stroke become more likely as you age. This may be due, in part, to the hormonal changes that your body experiences during menopause. These can affect how your body processes dietary fats, triglycerides, and cholesterol. Heart attack and stroke are both medical emergencies. There are many things that you can do to help  prevent heart disease and stroke:  Have your blood pressure checked at least every 1-2 years. High blood pressure causes heart disease and increases the risk of stroke.  If you are 83-80 years old, ask your health care provider if you should take aspirin to prevent a heart attack or a stroke.  Do not use any tobacco products, including cigarettes, chewing tobacco, or electronic cigarettes. If you need help quitting, ask your health care provider.  It is important to eat a healthy diet and maintain a healthy weight. ? Be sure to include plenty of vegetables, fruits, low-fat dairy products, and lean protein. ? Avoid eating foods that are high in solid fats, added sugars, or salt (sodium).  Get regular exercise. This is one of the most important things that you can do for your health. ? Try to exercise for at least 150 minutes each week. The type of exercise that you do should increase your heart rate and make you sweat. This is known as moderate-intensity exercise. ? Try to do strengthening exercises at least twice each week. Do these in addition to the moderate-intensity exercise.  Know your numbers.Ask your health care provider to check your cholesterol and your blood glucose. Continue to have your blood tested as directed by your health care provider.  What should I know about cancer screening? There are several types of cancer. Take the following steps to reduce your risk and to catch any cancer development as early as possible. Breast Cancer  Practice breast self-awareness. ? This means understanding how your breasts normally appear and feel. ? It also means doing regular breast self-exams. Let your health care provider know about any changes, no matter how small.  If you are 33 or older, have a clinician do a breast exam (clinical breast exam or CBE) every year. Depending on your age, family history, and medical history, it may be recommended that you also have a yearly breast X-ray  (mammogram).  If you have a family history of breast cancer, talk with your health care provider about genetic screening.  If you are at high risk for breast cancer, talk with your health care provider about having an MRI and a mammogram every year.  Breast cancer (BRCA) gene test is recommended for women who have family members with BRCA-related cancers. Results of the assessment will determine the need for genetic counseling and BRCA1 and for BRCA2 testing. BRCA-related cancers include these types: ? Breast. This occurs in males or females. ? Ovarian. ? Tubal. This may also be called fallopian tube cancer. ? Cancer of the abdominal or pelvic lining (peritoneal cancer). ? Prostate. ? Pancreatic.  Cervical, Uterine, and Ovarian Cancer Your health care provider may recommend that you be screened regularly for cancer of the pelvic organs. These include your ovaries, uterus, and vagina. This screening involves a pelvic exam, which includes checking for microscopic changes to the surface of your cervix (Pap test).  For women ages 21-65, health care providers may recommend a pelvic exam and a Pap test every three years. For women ages 81-65, they may recommend the Pap test and pelvic exam, combined with testing for human  papilloma virus (HPV), every five years. Some types of HPV increase your risk of cervical cancer. Testing for HPV may also be done on women of any age who have unclear Pap test results.  Other health care providers may not recommend any screening for nonpregnant women who are considered low risk for pelvic cancer and have no symptoms. Ask your health care provider if a screening pelvic exam is right for you.  If you have had past treatment for cervical cancer or a condition that could lead to cancer, you need Pap tests and screening for cancer for at least 20 years after your treatment. If Pap tests have been discontinued for you, your risk factors (such as having a new sexual  partner) need to be reassessed to determine if you should start having screenings again. Some women have medical problems that increase the chance of getting cervical cancer. In these cases, your health care provider may recommend that you have screening and Pap tests more often.  If you have a family history of uterine cancer or ovarian cancer, talk with your health care provider about genetic screening.  If you have vaginal bleeding after reaching menopause, tell your health care provider.  There are currently no reliable tests available to screen for ovarian cancer.  Lung Cancer Lung cancer screening is recommended for adults 41-50 years old who are at high risk for lung cancer because of a history of smoking. A yearly low-dose CT scan of the lungs is recommended if you:  Currently smoke.  Have a history of at least 30 pack-years of smoking and you currently smoke or have quit within the past 15 years. A pack-year is smoking an average of one pack of cigarettes per day for one year.  Yearly screening should:  Continue until it has been 15 years since you quit.  Stop if you develop a health problem that would prevent you from having lung cancer treatment.  Colorectal Cancer  This type of cancer can be detected and can often be prevented.  Routine colorectal cancer screening usually begins at age 24 and continues through age 79.  If you have risk factors for colon cancer, your health care provider may recommend that you be screened at an earlier age.  If you have a family history of colorectal cancer, talk with your health care provider about genetic screening.  Your health care provider may also recommend using home test kits to check for hidden blood in your stool.  A small camera at the end of a tube can be used to examine your colon directly (sigmoidoscopy or colonoscopy). This is done to check for the earliest forms of colorectal cancer.  Direct examination of the colon  should be repeated every 5-10 years until age 25. However, if early forms of precancerous polyps or small growths are found or if you have a family history or genetic risk for colorectal cancer, you may need to be screened more often.  Skin Cancer  Check your skin from head to toe regularly.  Monitor any moles. Be sure to tell your health care provider: ? About any new moles or changes in moles, especially if there is a change in a mole's shape or color. ? If you have a mole that is larger than the size of a pencil eraser.  If any of your family members has a history of skin cancer, especially at a young age, talk with your health care provider about genetic screening.  Always use sunscreen. Apply  sunscreen liberally and repeatedly throughout the day.  Whenever you are outside, protect yourself by wearing [place] sleeves, pants, a wide-brimmed hat, and sunglasses.  What should I know about osteoporosis? Osteoporosis is a condition in which bone destruction happens more quickly than new bone creation. After menopause, you may be at an increased risk for osteoporosis. To help prevent osteoporosis or the bone fractures that can happen because of osteoporosis, the following is recommended:  If you are 22-50 years old, get at least 1,000 mg of calcium and at least 600 mg of vitamin D per day.  If you are older than age 82 but younger than age 77, get at least 1,200 mg of calcium and at least 600 mg of vitamin D per day.  If you are older than age 59, get at least 1,200 mg of calcium and at least 800 mg of vitamin D per day.  Smoking and excessive alcohol intake increase the risk of osteoporosis. Eat foods that are rich in calcium and vitamin D, and do weight-bearing exercises several times each week as directed by your health care provider. What should I know about how menopause affects my mental health? Depression may occur at any age, but it is more common as you become older. Common symptoms of  depression include:  Low or sad mood.  Changes in sleep patterns.  Changes in appetite or eating patterns.  Feeling an overall lack of motivation or enjoyment of activities that you previously enjoyed.  Frequent crying spells.  Talk with your health care provider if you think that you are experiencing depression. What should I know about immunizations? It is important that you get and maintain your immunizations. These include:  Tetanus, diphtheria, and pertussis (Tdap) booster vaccine.  Influenza every year before the flu season begins.  Pneumonia vaccine.  Shingles vaccine.  Your health care provider may also recommend other immunizations. This information is not intended to replace advice given to you by your health care provider. Make sure you discuss any questions you have with your health care provider. Document Released: 06/16/2005 Document Revised: 11/12/2015 Document Reviewed: 01/26/2015 Elsevier Interactive Patient Education  2018 [employer].

## 2017-08-25 LAB — FECAL OCCULT BLOOD, IMMUNOCHEMICAL: Fecal Occult Bld: NEGATIVE

## 2017-09-14 ENCOUNTER — Ambulatory Visit
Admission: RE | Admit: 2017-09-14 | Discharge: 2017-09-14 | Disposition: A | Discharge status: Home / Self Care | Payer: 59 | Source: Ambulatory Visit | Attending: Obstetrics and Gynecology | Admitting: Obstetrics and Gynecology

## 2017-09-14 DIAGNOSIS — Z1231 Encounter for screening mammogram for malignant neoplasm of breast: Secondary | ICD-10-CM | POA: Insufficient documentation

## 2017-09-14 DIAGNOSIS — Z1239 Encounter for other screening for malignant neoplasm of breast: Secondary | ICD-10-CM

## 2017-09-19 ENCOUNTER — Other Ambulatory Visit: Payer: Self-pay

## 2017-09-19 MED ORDER — OSPEMIFENE 60 MG PO TABS: 60.0000 mg | ORAL_TABLET | Freq: Every day | ORAL | 2 refills | Status: DC

## 2017-11-23 ENCOUNTER — Telehealth: Payer: Self-pay | Admitting: Family Medicine

## 2017-11-23 NOTE — Telephone Encounter (Signed)
Pt stated that she faxed a form either end of last week or first of this week to be completed for work that she got her physical. Pt is requesting call to verify form was received and update on the status. Please advise. Thanks TNP

## 2017-11-23 NOTE — Telephone Encounter (Signed)
Have you seen a form for this patient?

## 2017-11-26 NOTE — Telephone Encounter (Signed)
Should be done.

## 2017-11-28 NOTE — Telephone Encounter (Signed)
Done

## 2018-01-10 ENCOUNTER — Other Ambulatory Visit: Payer: Self-pay | Admitting: Family Medicine

## 2018-01-10 DIAGNOSIS — F419 Anxiety disorder, unspecified: Secondary | ICD-10-CM

## 2018-01-30 ENCOUNTER — Telehealth: Payer: Self-pay | Admitting: Obstetrics and Gynecology

## 2018-01-30 NOTE — Telephone Encounter (Signed)
The patient Tonya Guzman asking for the nurse to call her about her HRT, please advise, thanks.

## 2018-01-31 ENCOUNTER — Ambulatory Visit (INDEPENDENT_AMBULATORY_CARE_PROVIDER_SITE_OTHER): Payer: BLUE CROSS/BLUE SHIELD | Admitting: Family Medicine

## 2018-01-31 ENCOUNTER — Encounter: Payer: Self-pay | Admitting: Family Medicine

## 2018-01-31 DIAGNOSIS — Z23 Encounter for immunization: Secondary | ICD-10-CM | POA: Diagnosis not present

## 2018-01-31 DIAGNOSIS — Z0000 Encounter for general adult medical examination without abnormal findings: Secondary | ICD-10-CM | POA: Diagnosis not present

## 2018-01-31 LAB — POCT URINALYSIS DIPSTICK
BILIRUBIN UA: NEGATIVE
Glucose, UA: NEGATIVE
Ketones, UA: NEGATIVE
LEUKOCYTES UA: NEGATIVE
NITRITE UA: NEGATIVE
PROTEIN UA: NEGATIVE
RBC UA: NEGATIVE
Spec Grav, UA: 1.01 (ref 1.010–1.025)
Urobilinogen, UA: 0.2 E.U./dL
pH, UA: 5 (ref 5.0–8.0)

## 2018-01-31 NOTE — Telephone Encounter (Signed)
Pt has been on osphena x 5 months. She has been having leg pain and profusely sweating day and nite. She d/c osphena 3 days ago. Leg pain is better today. NO swelling, redness, or warm to the touch. She was wanting to go back on intrarosa. Was on it last year but last office notes states it was  ineffective. Advised pt that we will ask mad. Pt aware mad will get this message on Tuesday.

## 2018-01-31 NOTE — Progress Notes (Signed)
Patient: Tonya Guzman, Female    DOB: 1961-04-20, 57 y.o.   MRN: [id number] Visit Date: 01/31/2018  Today's Provider: Wilhemena Durie, MD   Chief Complaint  Patient presents with  . Annual Exam   Subjective:    Annual physical exam Tonya Guzman is a 57 y.o. female who presents today for health maintenance and complete physical. She feels well. She reports exercising daily by walking dog. She reports she is sleeping well.  Last Reported: Tdap 03/13/07 Patient sees Gynecologist Dr. Enzo Bi at [place] reported- Mammogram 09/14/17 ( normal repeat 1 year)   Pap w/ HPV 04/18/16 ( normal, HPV negative) -----------------------------------------------------------------   Review of Systems  Constitutional: Positive for diaphoresis.  HENT: Negative.   Eyes: Negative.   Respiratory: Negative.   Cardiovascular: Negative.   Gastrointestinal: Negative.   Endocrine: Negative.   Musculoskeletal: Negative.   Allergic/Immunologic: Negative.   Psychiatric/Behavioral: Negative.   All other systems reviewed and are negative.   Social History She  reports that she quit smoking about 5 years ago. Her smoking use included cigarettes. She has a 0.25 pack-year smoking history. She has never used smokeless tobacco. She reports that she drinks alcohol. She reports that she does not use drugs. Social History   Socioeconomic History  . Marital status: Married    Spouse name: Not on file  . Number of children: Not on file  . Years of education: Not on file  . Highest education level: Not on file  Occupational History  . Not on file  Social Needs  . Financial resource strain: Not on file  . Food insecurity:    Worry: Not on file    Inability: Not on file  . Transportation needs:    Medical: Not on file    Non-medical: Not on file  Tobacco Use  . Smoking status: Former Smoker    Packs/day: 0.25    Years: 1.00    Pack years: 0.25    Types: Cigarettes    Last  attempt to quit: 05/08/2012    Years since quitting: 5.7  . Smokeless tobacco: Never Used  Substance and Sexual Activity  . Alcohol use: Yes    Comment: 3 drinks a week  . Drug use: No  . Sexual activity: Not Currently    Birth control/protection: Post-menopausal  Lifestyle  . Physical activity:    Days per week: 2 days    Minutes per session: 30 min  . Stress: Not on file  Relationships  . Social connections:    Talks on phone: Not on file    Gets together: Not on file    Attends religious service: Not on file    Active member of club or organization: Not on file    Attends meetings of clubs or organizations: Not on file    Relationship status: Not on file  Other Topics Concern  . Not on file  Social History Narrative  . Not on file    Patient Active Problem List   Diagnosis Date Noted  . Menopausal vaginal dryness 04/18/2016  . Menopause 04/18/2016    Past Surgical History:  Procedure Laterality Date  . CESAREAN SECTION    . FOOT SURGERY Right   . KIDNEY SURGERY      Family History  Family Status  Relation Name Status  . Mother  Alive  . Father  Deceased at age 48  . Sister  Alive  . Brother  Alive  .  Sister  Alive  . Brother  Deceased  . Neg Hx  (Not Specified)   Her family history includes Heart attack in her father; Heart disease in her father; Hepatitis C in her brother; Hyperlipidemia in her father; Hypertension in her father, mother, sister, and sister; Pneumonia in her mother.     No Known Allergies  Previous Medications   ALPRAZOLAM (XANAX) 0.5 MG TABLET    TAKE 1 TABLET BY MOUTH EVERYDAY AT BEDTIME   ASPIRIN EC 81 MG TABLET    Take 81 mg by mouth daily.   MAGNESIUM CHLORIDE (MAGNESIUM DR PO)    Take by mouth.   NAPROXEN (NAPROSYN) 500 MG TABLET    Take 1 tablet (500 mg total) by mouth 2 (two) times daily as needed. for pain   NASONEX 50 MCG/ACT NASAL SPRAY    Reported on 07/21/2015   PAZEO 0.7 % SOLN    INSTILL 1 DROP INTO BOTH EYES EVERY DAY     Patient Care Team: Jerrol Banana., MD as PCP - General (Family Medicine)      Objective:   Vitals: BP 98/70   Pulse 72   Temp 98.2 F (36.8 C) (Oral)   Resp 16   Ht 5\' 6"  (1.676 m)   Wt 172 lb (78 kg)   SpO2 95%   BMI 27.76 kg/m    Physical Exam  Constitutional: She is oriented to person, place, and time. She appears well-developed and well-nourished.  HENT:  Head: Normocephalic and atraumatic.  Eyes: Conjunctivae are normal. No scleral icterus.  Neck: No thyromegaly present.  Cardiovascular: Normal rate, regular rhythm and normal heart sounds.  Pulmonary/Chest: Effort normal and breath sounds normal.  Abdominal: Soft.  Musculoskeletal: She exhibits tenderness. She exhibits no edema.  Mild right inner thigh tenderness without mass effect of skin change or thrombophlebitis.  Neurological: She is alert and oriented to person, place, and time.  Skin: Skin is warm and dry.  Psychiatric: She has a normal mood and affect. Her behavior is normal. Judgment and thought content normal.     Depression Screen PHQ 2/9 Scores 01/31/2018 01/30/2017 01/26/2016 12/30/2014  PHQ - 2 Score 0 2 0 0  PHQ- 9 Score 0 7 - -      Assessment & Plan:     Routine Health Maintenance and Physical Exam  Exercise Activities and Dietary recommendations Goals   None     Immunization History  Administered Date(s) Administered  . Tdap 03/13/2007    Health Maintenance  Topic Date Due  . HIV Screening  04/18/1974  . TETANUS/TDAP  03/12/2017  . INFLUENZA VACCINE  12/06/2017  . PAP SMEAR  04/19/2019  . MAMMOGRAM  09/15/2019  . COLONOSCOPY  01/04/2021  . Hepatitis C Screening  Completed   Gyn per Dr D. Colonoscopy 01/05/11. Discussed health benefits of physical activity, and encouraged her to engage in regular exercise appropriate for her age and condition.  Postmenopausal symptoms.   -------------------------------------------------------------------- I have done the exam  and reviewed the chart and it is accurate to the best of my knowledge. Development worker, community has been used and  any errors in dictation or transcription are unintentional. Miguel Aschoff M.D. [facility]

## 2018-02-04 ENCOUNTER — Telehealth: Payer: Self-pay | Admitting: Obstetrics and Gynecology

## 2018-02-04 MED ORDER — PRASTERONE 6.5 MG VA INST: 6.5000 mg | VAGINAL_INSERT | Freq: Every day | VAGINAL | 11 refills | Status: DC

## 2018-02-04 NOTE — Telephone Encounter (Signed)
Error

## 2018-02-04 NOTE — Telephone Encounter (Signed)
Per pt meds sent to [facility] [address].

## 2018-02-04 NOTE — Telephone Encounter (Signed)
LM - Need clarification on which pharmacy to send intrarosa.

## 2018-02-06 LAB — CBC WITH DIFFERENTIAL/PLATELET
Basophils Absolute: 0.1 10*3/uL (ref 0.0–0.2)
Basos: 1 %
EOS (ABSOLUTE): 0.2 10*3/uL (ref 0.0–0.4)
EOS: 5 %
HEMATOCRIT: 38.3 % (ref 34.0–46.6)
HEMOGLOBIN: 13.2 g/dL (ref 11.1–15.9)
IMMATURE GRANS (ABS): 0 10*3/uL (ref 0.0–0.1)
Immature Granulocytes: 0 %
LYMPHS: 36 %
Lymphocytes Absolute: 1.5 10*3/uL (ref 0.7–3.1)
MCH: 31.8 pg (ref 26.6–33.0)
MCHC: 34.5 g/dL (ref 31.5–35.7)
MCV: 92 fL (ref 79–97)
Monocytes Absolute: 0.5 10*3/uL (ref 0.1–0.9)
Monocytes: 11 %
NEUTROS ABS: 2 10*3/uL (ref 1.4–7.0)
Neutrophils: 47 %
Platelets: 252 10*3/uL (ref 150–450)
RBC: 4.15 x10E6/uL (ref 3.77–5.28)
RDW: 13.5 % (ref 12.3–15.4)
WBC: 4.3 10*3/uL (ref 3.4–10.8)

## 2018-02-06 LAB — LIPID PANEL
Chol/HDL Ratio: 3.5 ratio (ref 0.0–4.4)
Cholesterol, Total: 244 mg/dL — ABNORMAL HIGH (ref 100–199)
HDL: 70 mg/dL (ref >39)
LDL Calculated: 152 mg/dL — ABNORMAL HIGH (ref 0–99)
Triglycerides: 110 mg/dL (ref 0–149)
VLDL CHOLESTEROL CAL: 22 mg/dL (ref 5–40)

## 2018-02-06 LAB — COMPREHENSIVE METABOLIC PANEL
ALBUMIN: 4.5 g/dL (ref 3.5–5.5)
ALT: 35 IU/L — ABNORMAL HIGH (ref 0–32)
AST: 21 IU/L (ref 0–40)
Albumin/Globulin Ratio: 1.8 (ref 1.2–2.2)
Alkaline Phosphatase: 66 IU/L (ref 39–117)
BILIRUBIN TOTAL: 0.4 mg/dL (ref 0.0–1.2)
BUN/Creatinine Ratio: 14 (ref 9–23)
BUN: 13 mg/dL (ref 6–24)
CHLORIDE: 104 mmol/L (ref 96–106)
CO2: 22 mmol/L (ref 20–29)
Calcium: 9.6 mg/dL (ref 8.7–10.2)
Creatinine, Ser: 0.94 mg/dL (ref 0.57–1.00)
GFR calc Af Amer: 77 mL/min/{1.73_m2} (ref >59)
GFR calc non Af Amer: 67 mL/min/{1.73_m2} (ref >59)
GLOBULIN, TOTAL: 2.5 g/dL (ref 1.5–4.5)
GLUCOSE: 108 mg/dL — AB (ref 65–99)
Potassium: 4.2 mmol/L (ref 3.5–5.2)
Sodium: 142 mmol/L (ref 134–144)
TOTAL PROTEIN: 7 g/dL (ref 6.0–8.5)

## 2018-02-06 LAB — TSH: TSH: 2.95 u[IU]/mL (ref 0.450–4.500)

## 2018-02-07 ENCOUNTER — Telehealth: Payer: Self-pay

## 2018-02-07 NOTE — Telephone Encounter (Signed)
-----   Message from Jerrol Banana., MD sent at 02/07/2018  4:08 PM EDT ----- Stable.

## 2018-02-07 NOTE — Telephone Encounter (Signed)
LMTCB

## 2018-02-08 NOTE — Telephone Encounter (Signed)
Advised  ED 

## 2018-05-08 HISTORY — PX: BREAST LUMPECTOMY: SHX2

## 2018-05-15 ENCOUNTER — Other Ambulatory Visit: Payer: Self-pay | Admitting: Family Medicine

## 2018-05-15 DIAGNOSIS — F419 Anxiety disorder, unspecified: Secondary | ICD-10-CM

## 2018-05-17 ENCOUNTER — Other Ambulatory Visit: Payer: Self-pay | Admitting: Family Medicine

## 2018-06-17 ENCOUNTER — Other Ambulatory Visit: Payer: Self-pay | Admitting: Family Medicine

## 2018-06-17 DIAGNOSIS — F419 Anxiety disorder, unspecified: Secondary | ICD-10-CM

## 2018-06-18 NOTE — Telephone Encounter (Signed)
Pharmacy requesting refills. Thanks!  

## 2018-10-23 ENCOUNTER — Other Ambulatory Visit: Payer: Self-pay | Admitting: Family Medicine

## 2018-10-23 DIAGNOSIS — F419 Anxiety disorder, unspecified: Secondary | ICD-10-CM

## 2018-10-24 NOTE — Telephone Encounter (Signed)
Please review

## 2018-12-05 ENCOUNTER — Encounter: Payer: BC Managed Care – PPO | Admitting: Family Medicine

## 2018-12-05 ENCOUNTER — Other Ambulatory Visit: Payer: Self-pay

## 2018-12-05 NOTE — Progress Notes (Signed)
       Patient: Tonya Guzman Female    DOB: [birth year]   58 y.o.   MRN: [id number] Visit Date: 12/05/2018  Today's Provider: Wilhemena Durie, MD   No chief complaint on file.  Subjective:    Virtual Visit via Video Note  I connected with Tonya Guzman on 12/05/18 at  1:40 PM EDT by a video enabled telemedicine application and verified that I am speaking with the correct person using two identifiers.  I discussed the limitations of evaluation and management by telemedicine and the availability of in person appointments. The patient expressed understanding and agreed to proceed.  History of Present Illness:    Observations/Objective:     No Known Allergies   Current Outpatient Medications:  .  ALPRAZolam (XANAX) 0.5 MG tablet, TAKE 1 TABLET BY MOUTH AT BEDTIME AS NEEDED, Disp: 30 tablet, Rfl: 2 .  aspirin EC 81 MG tablet, Take 81 mg by mouth daily., Disp: , Rfl:  .  Magnesium Chloride (MAGNESIUM DR PO), Take by mouth., Disp: , Rfl:  .  naproxen (NAPROSYN) 500 MG tablet, Take 1 tablet (500 mg total) by mouth 2 (two) times daily as needed. for pain, Disp: 180 tablet, Rfl: 3 .  NASONEX 50 MCG/ACT nasal spray, Reported on 07/21/2015, Disp: , Rfl:  .  PAZEO 0.7 % SOLN, INSTILL 1 DROP INTO BOTH EYES EVERY DAY, Disp: , Rfl: 5 .  Prasterone (INTRAROSA) 6.5 MG INST, Place 6.5 mg vaginally at bedtime., Disp: 30 each, Rfl: 11  Review of Systems  Social History   Tobacco Use  . Smoking status: Former Smoker    Packs/day: 0.25    Years: 1.00    Pack years: 0.25    Types: Cigarettes    Quit date: 05/08/2012    Years since quitting: 6.5  . Smokeless tobacco: Never Used  Substance Use Topics  . Alcohol use: Yes    Comment: 3 drinks a week      Objective:   There were no vitals taken for this visit. There were no vitals filed for this visit.   Physical Exam   No results found for any visits on 12/05/18.     Assessment & Plan    Follow Up Instructions: Unable to get  through on Doxy me   I discussed the assessment and treatment plan with the patient. The patient was provided an opportunity to ask questions and all were answered. The patient agreed with the plan and demonstrated an understanding of the instructions.   The patient was advised to call back or seek an in-person evaluation if the symptoms worsen or if the condition fails to improve as anticipated.  I provided 0 minutes of non-face-to-face time during this encounter.       Richard Cranford Mon, MD  [facility]

## 2019-01-28 ENCOUNTER — Ambulatory Visit (INDEPENDENT_AMBULATORY_CARE_PROVIDER_SITE_OTHER): Payer: BC Managed Care – PPO | Admitting: Obstetrics and Gynecology

## 2019-01-28 ENCOUNTER — Encounter: Payer: Self-pay | Admitting: Obstetrics and Gynecology

## 2019-01-28 ENCOUNTER — Other Ambulatory Visit: Payer: Self-pay

## 2019-01-28 VITALS — BP 120/90 | Ht 67.0 in | Wt 165.0 lb

## 2019-01-28 DIAGNOSIS — Z1239 Encounter for other screening for malignant neoplasm of breast: Secondary | ICD-10-CM

## 2019-01-28 DIAGNOSIS — Z01419 Encounter for gynecological examination (general) (routine) without abnormal findings: Secondary | ICD-10-CM | POA: Diagnosis not present

## 2019-01-28 DIAGNOSIS — N951 Menopausal and female climacteric states: Secondary | ICD-10-CM

## 2019-01-28 MED ORDER — ESTRADIOL 0.1 MG/GM VA CREA: 1.0000 | TOPICAL_CREAM | Freq: Every day | VAGINAL | 1 refills | Status: DC

## 2019-01-28 NOTE — Progress Notes (Signed)
PCP: Jerrol Banana., MD   Chief Complaint  Patient presents with  . Gynecologic Exam    vaginal dryness, menopause    HPI:      [name] is a 58 y.o. [id number] who LMP was No LMP recorded. Patient is postmenopausal., presents today for her NP> 3 yrs annual examination.  Her menses are absent due to menopause (2015). No pelvic pain or PMB. Occas vasomotor sx.    Sex activity--very limited due to dyspareunia: single partner, contraception - post menopausal status. She does have vaginal dryness. She used estrace crm in the past and liked that better than recent intrarosa use that has not been helpful. Has tried coconut oil with intrarosa. No vag sx if not sex active.   Last Pap: April 18, 2016  Results were: no abnormalities /neg HPV DNA.   Last mammogram: Sep 14, 2017  Results were: normal--routine follow-up in 12 months There is a FH of breast cancer in her mat aunt, genetic testing not indicated. There is no FH of ovarian cancer. The patient does do self-breast exams.  Colonoscopy: age 18 (9 yrs ago); Repeat due after 10 years.   Tobacco use: The patient denies current or previous tobacco use. Alcohol use: social drinker  No drug use. Exercise: moderately active  She does get adequate calcium but not Vitamin D in her diet.  Labs with PCP.   Past Medical History:  Diagnosis Date  . Fatigue   . Insomnia   . Night sweats     Past Surgical History:  Procedure Laterality Date  . CESAREAN SECTION    . COLONOSCOPY  2010   repeat due after 10 yrs  . FOOT SURGERY Right   . KIDNEY SURGERY      Family History  Problem Relation Age of Onset  . Pneumonia Mother   . Hypertension Mother   . Anemia Mother   . Hyperlipidemia Father   . Hypertension Father   . Heart disease Father   . Heart attack Father   . Hypertension Sister   . Hypertension Sister   . Hepatitis C Brother   . Breast cancer Maternal Aunt 70  . Ovarian cancer Neg Hx   . Colon cancer  Neg Hx   . Diabetes Neg Hx     Social History   Socioeconomic History  . Marital status: Married    Spouse name: Not on file  . Number of children: Not on file  . Years of education: Not on file  . Highest education level: Not on file  Occupational History  . Not on file  Social Needs  . Financial resource strain: Not on file  . Food insecurity    Worry: Not on file    Inability: Not on file  . Transportation needs    Medical: Not on file    Non-medical: Not on file  Tobacco Use  . Smoking status: Former Smoker    Packs/day: 0.25    Years: 1.00    Pack years: 0.25    Types: Cigarettes    Quit date: 05/08/2012    Years since quitting: 6.7  . Smokeless tobacco: Never Used  Substance and Sexual Activity  . Alcohol use: Yes    Comment: 3 drinks a week  . Drug use: No  . Sexual activity: Not Currently    Birth control/protection: Post-menopausal  Lifestyle  . Physical activity    Days per week: 2 days    Minutes per session:  30 min  . Stress: Not on file  Relationships  . Social Herbalist on phone: Not on file    Gets together: Not on file    Attends religious service: Not on file    Active member of club or organization: Not on file    Attends meetings of clubs or organizations: Not on file    Relationship status: Not on file  . Intimate partner violence    Fear of current or ex partner: Not on file    Emotionally abused: Not on file    Physically abused: Not on file    Forced sexual activity: Not on file  Other Topics Concern  . Not on file  Social History Narrative  . Not on file     Current Outpatient Medications:  .  ALPRAZolam (XANAX) 0.5 MG tablet, TAKE 1 TABLET BY MOUTH AT BEDTIME AS NEEDED, Disp: 30 tablet, Rfl: 2 .  aspirin EC 81 MG tablet, Take 81 mg by mouth daily., Disp: , Rfl:  .  Magnesium Chloride (MAGNESIUM DR PO), Take by mouth., Disp: , Rfl:  .  NASONEX 50 MCG/ACT nasal spray, Reported on 07/21/2015, Disp: , Rfl:  .  estradiol  (ESTRACE) 0.1 MG/GM vaginal cream, Place 1 Applicatorful vaginally at bedtime. Insert 1g nightly for 1 wk, then 1 g once weekly as maintenace, Disp: 42.5 g, Rfl: 1 .  Prasterone (INTRAROSA) 6.5 MG INST, Place 6.5 mg vaginally at bedtime. (Patient not taking: Reported on 01/28/2019), Disp: 30 each, Rfl: 11     ROS:  Review of Systems  Constitutional: Negative for fatigue, fever and unexpected weight change.  Respiratory: Negative for cough, shortness of breath and wheezing.   Cardiovascular: Negative for chest pain, palpitations and leg swelling.  Gastrointestinal: Negative for blood in stool, constipation, diarrhea, nausea and vomiting.  Endocrine: Negative for cold intolerance, heat intolerance and polyuria.  Genitourinary: Positive for dyspareunia. Negative for dysuria, flank pain, frequency, genital sores, hematuria, menstrual problem, pelvic pain, urgency, vaginal bleeding, vaginal discharge and vaginal pain.  Musculoskeletal: Negative for back pain, joint swelling and myalgias.  Skin: Negative for rash.  Neurological: Negative for dizziness, syncope, light-headedness, numbness and headaches.  Hematological: Negative for adenopathy.  Psychiatric/Behavioral: Negative for agitation, confusion, sleep disturbance and suicidal ideas. The patient is not nervous/anxious.    BREAST: No symptoms    Objective: BP 120/90   Ht 5\' 7"  (1.702 m)   Wt 165 lb (74.8 kg)   BMI 25.84 kg/m    Physical Exam Constitutional:      Appearance: She is well-developed.  Genitourinary:     Vulva, vagina, cervix, uterus, right adnexa and left adnexa normal.     No vulval lesion or tenderness noted.     No vaginal discharge, erythema or tenderness.     No cervical polyp.     Uterus is not enlarged or tender.     No right or left adnexal mass present.     Right adnexa not tender.     Left adnexa not tender.     Genitourinary Comments: VAG TISSUE LOOKS FAIRLY HEALTHY OVERALL; MINIMAL ATROPHY; NO PAIN  WITH EXAM  Neck:     Musculoskeletal: Normal range of motion.     Thyroid: No thyromegaly.  Cardiovascular:     Rate and Rhythm: Normal rate and regular rhythm.     Heart sounds: Normal heart sounds. No murmur.  Pulmonary:     Effort: Pulmonary effort is normal.  Breath sounds: Normal breath sounds.  Chest:     Breasts:        Right: No mass, nipple discharge, skin change or tenderness.        Left: No mass, nipple discharge, skin change or tenderness.  Abdominal:     Palpations: Abdomen is soft.     Tenderness: There is no abdominal tenderness. There is no guarding.  Musculoskeletal: Normal range of motion.  Neurological:     General: No focal deficit present.     Mental Status: She is alert and oriented to person, place, and time.     Cranial Nerves: No cranial nerve deficit.  Skin:    General: Skin is warm and dry.  Psychiatric:        Mood and Affect: Mood normal.        Behavior: Behavior normal.        Thought Content: Thought content normal.        Judgment: Judgment normal.  Vitals signs reviewed.     Assessment/Plan:  Encounter for annual routine gynecological examination  Screening for breast cancer - Plan: MM 3D SCREEN BREAST BILATERAL--pt to sched mammo  Vaginal dryness, menopausal - Plan: estradiol (ESTRACE) 0.1 MG/GM vaginal cream; stop intrarosa, restart estrace. Add coconut oil. F/u prn.    Meds ordered this encounter  Medications  . estradiol (ESTRACE) 0.1 MG/GM vaginal cream    Sig: Place 1 Applicatorful vaginally at bedtime. Insert 1g nightly for 1 wk, then 1 g once weekly as maintenace    Dispense:  42.5 g    Refill:  1    Order Specific Question:   Supervising Provider    Answer:   Gae Dry [id number]            GYN counsel breast self exam, mammography screening, menopause, adequate intake of calcium and vitamin D, diet and exercise    F/U  Return in about 1 year (around 123XX123) for annual.  Alicia B. Copland, PA-C 01/28/2019  10:00 AM

## 2019-01-28 NOTE — Patient Instructions (Signed)
I value your feedback and entrusting us with your care. If you get a [place] patient survey, I would appreciate you taking the time to let us know about your experience today. Thank you!  [facility]: [phone]    

## 2019-01-29 ENCOUNTER — Telehealth: Payer: Self-pay

## 2019-01-29 NOTE — Telephone Encounter (Signed)
Preventive Care Exam Form faxed and pt aware.

## 2019-02-04 ENCOUNTER — Encounter: Payer: Self-pay | Admitting: Family Medicine

## 2019-02-19 ENCOUNTER — Other Ambulatory Visit: Payer: Self-pay | Admitting: Family Medicine

## 2019-02-19 DIAGNOSIS — F419 Anxiety disorder, unspecified: Secondary | ICD-10-CM

## 2019-03-05 ENCOUNTER — Ambulatory Visit
Admission: RE | Admit: 2019-03-05 | Discharge: 2019-03-05 | Disposition: A | Discharge status: Home / Self Care | Payer: BLUE CROSS/BLUE SHIELD | Source: Ambulatory Visit | Attending: Obstetrics and Gynecology | Admitting: Obstetrics and Gynecology

## 2019-03-05 DIAGNOSIS — Z1231 Encounter for screening mammogram for malignant neoplasm of breast: Secondary | ICD-10-CM | POA: Diagnosis not present

## 2019-03-05 DIAGNOSIS — Z1239 Encounter for other screening for malignant neoplasm of breast: Secondary | ICD-10-CM | POA: Diagnosis present

## 2019-03-06 ENCOUNTER — Other Ambulatory Visit: Payer: Self-pay | Admitting: Obstetrics and Gynecology

## 2019-03-06 DIAGNOSIS — N6489 Other specified disorders of breast: Secondary | ICD-10-CM

## 2019-03-06 DIAGNOSIS — R928 Other abnormal and inconclusive findings on diagnostic imaging of breast: Secondary | ICD-10-CM

## 2019-03-09 DIAGNOSIS — C50919 Malignant neoplasm of unspecified site of unspecified female breast: Secondary | ICD-10-CM

## 2019-03-09 HISTORY — DX: Malignant neoplasm of unspecified site of unspecified female breast: C50.919

## 2019-03-14 ENCOUNTER — Ambulatory Visit
Admission: RE | Admit: 2019-03-14 | Discharge: 2019-03-14 | Disposition: A | Discharge status: Home / Self Care | Payer: BC Managed Care – PPO | Source: Ambulatory Visit | Attending: Obstetrics and Gynecology | Admitting: Obstetrics and Gynecology

## 2019-03-14 DIAGNOSIS — N6489 Other specified disorders of breast: Secondary | ICD-10-CM | POA: Insufficient documentation

## 2019-03-14 DIAGNOSIS — R928 Other abnormal and inconclusive findings on diagnostic imaging of breast: Secondary | ICD-10-CM

## 2019-03-17 ENCOUNTER — Other Ambulatory Visit: Payer: Self-pay | Admitting: Obstetrics and Gynecology

## 2019-03-17 DIAGNOSIS — N6310 Unspecified lump in the right breast, unspecified quadrant: Secondary | ICD-10-CM

## 2019-03-17 DIAGNOSIS — R928 Other abnormal and inconclusive findings on diagnostic imaging of breast: Secondary | ICD-10-CM

## 2019-03-17 NOTE — Progress Notes (Signed)
Patient: Tonya Guzman, Female    DOB: Jul 10, 1960, 58 y.o.   MRN: [id number] Visit Date: 03/18/2019  Today's Provider: Wilhemena Durie, MD   Chief Complaint  Patient presents with  . Annual Exam   Subjective:     Annual physical exam Tonya Guzman is a 58 y.o. female who presents today for health maintenance and complete physical. She feels well. She reports exercising some. She reports she is sleeping fairly well. She is now taking care of her mother in pt home.  -----------------------------------------------------------  Pap: 04/18/2016= normal at [place]: 03/14/2019 Colonoscopy: 02/05/2017  Review of Systems  Constitutional: Positive for activity change.  HENT: Negative.   Eyes: Positive for itching.  Respiratory: Negative.   Cardiovascular: Negative.   Gastrointestinal: Negative.   Endocrine: Negative.   Allergic/Immunologic: Negative.   Hematological: Negative.   Psychiatric/Behavioral: Negative.     Social History      She  reports that she quit smoking about 6 years ago. Her smoking use included cigarettes. She has a 0.25 pack-year smoking history. She has never used smokeless tobacco. She reports current alcohol use. She reports that she does not use drugs.       Social History   Socioeconomic History  . Marital status: Married    Spouse name: Not on file  . Number of children: Not on file  . Years of education: Not on file  . Highest education level: Not on file  Occupational History  . Not on file  Social Needs  . Financial resource strain: Not on file  . Food insecurity    Worry: Not on file    Inability: Not on file  . Transportation needs    Medical: Not on file    Non-medical: Not on file  Tobacco Use  . Smoking status: Former Smoker    Packs/day: 0.25    Years: 1.00    Pack years: 0.25    Types: Cigarettes    Quit date: 05/08/2012    Years since quitting: 6.8  . Smokeless tobacco: Never Used  Substance and Sexual  Activity  . Alcohol use: Yes    Comment: 3 drinks a week  . Drug use: No  . Sexual activity: Not Currently    Birth control/protection: Post-menopausal  Lifestyle  . Physical activity    Days per week: 2 days    Minutes per session: 30 min  . Stress: Not on file  Relationships  . Social Herbalist on phone: Not on file    Gets together: Not on file    Attends religious service: Not on file    Active member of club or organization: Not on file    Attends meetings of clubs or organizations: Not on file    Relationship status: Not on file  Other Topics Concern  . Not on file  Social History Narrative  . Not on file    Past Medical History:  Diagnosis Date  . Fatigue   . Insomnia   . Night sweats      Patient Active Problem List   Diagnosis Date Noted  . Menopausal vaginal dryness 04/18/2016  . Menopause 04/18/2016    Past Surgical History:  Procedure Laterality Date  . CESAREAN SECTION    . COLONOSCOPY  2010   repeat due after 10 yrs  . FOOT SURGERY Right   . KIDNEY SURGERY      Family History  Family Status  Relation Name Status  . Mother  Alive  . Father  Deceased at age 86  . Sister  Alive  . Brother  Alive  . Sister  Alive  . Brother  Deceased  . Mat Aunt  (Not Specified)  . Neg Hx  (Not Specified)        Her family history includes Anemia in her mother; Breast cancer (age of onset: 34) in her maternal aunt; Heart attack in her father; Heart disease in her father; Hepatitis C in her brother; Hyperlipidemia in her father; Hypertension in her father, mother, sister, and sister; Pneumonia in her mother. There is no history of Ovarian cancer, Colon cancer, or Diabetes.      No Known Allergies   Current Outpatient Medications:  .  ALPRAZolam (XANAX) 0.5 MG tablet, TAKE ONE TABLET BY MOUTH AT BEDTIME AS NEEDED, Disp: 30 tablet, Rfl: 0 .  aspirin EC 81 MG tablet, Take 81 mg by mouth daily., Disp: , Rfl:  .  estradiol (ESTRACE) 0.1 MG/GM  vaginal cream, Place 1 Applicatorful vaginally at bedtime. Insert 1g nightly for 1 wk, then 1 g once weekly as maintenace, Disp: 42.5 g, Rfl: 1 .  Magnesium Chloride (MAGNESIUM DR PO), Take by mouth., Disp: , Rfl:  .  NASONEX 50 MCG/ACT nasal spray, Reported on 07/21/2015, Disp: , Rfl:  .  Prasterone (INTRAROSA) 6.5 MG INST, Place 6.5 mg vaginally at bedtime. (Patient not taking: Reported on 01/28/2019), Disp: 30 each, Rfl: 11   Patient Care Team: Jerrol Banana., MD as PCP - General (Family Medicine)    Objective:    Vitals: BP 109/74 (BP Location: Right Arm, Patient Position: Sitting, Cuff Size: Large)   Pulse 69   Temp (!) 97.5 F (36.4 C) (Other (Comment))   Resp 16   Ht 5\' 7"  (1.702 m)   Wt 160 lb (72.6 kg)   SpO2 96%   BMI 25.06 kg/m    Vitals:   03/18/19 1529  BP: 109/74  Pulse: 69  Resp: 16  Temp: (!) 97.5 F (36.4 C)  TempSrc: Other (Comment)  SpO2: 96%  Weight: 160 lb (72.6 kg)  Height: 5\' 7"  (1.702 m)     Physical Exam Vitals signs reviewed.  Constitutional:      Appearance: She is well-developed.  HENT:     Head: Normocephalic and atraumatic.  Eyes:     General: No scleral icterus.    Conjunctiva/sclera: Conjunctivae normal.  Neck:     Thyroid: No thyromegaly.  Cardiovascular:     Rate and Rhythm: Normal rate and regular rhythm.     Heart sounds: Normal heart sounds.  Pulmonary:     Effort: Pulmonary effort is normal.     Breath sounds: Normal breath sounds.  Abdominal:     Palpations: Abdomen is soft.  Skin:    General: Skin is warm and dry.  Neurological:     Mental Status: She is alert and oriented to person, place, and time.  Psychiatric:        Behavior: Behavior normal.        Thought Content: Thought content normal.        Judgment: Judgment normal.      Depression Screen PHQ 2/9 Scores 03/18/2019 01/31/2018 01/30/2017 01/26/2016  PHQ - 2 Score 0 0 2 0  PHQ- 9 Score 1 0 7 -       Assessment & Plan:     Routine Health  Maintenance and Physical Exam  Exercise Activities and Dietary recommendations Goals   None     Immunization History  Administered Date(s) Administered  . Td 01/31/2018  . Tdap 03/13/2007    Health Maintenance  Topic Date Due  . HIV Screening  04/18/1974  . PAP SMEAR-Modifier  04/19/2019  . INFLUENZA VACCINE  08/06/2019 (Originally 12/07/2018)  . COLONOSCOPY  01/04/2021  . MAMMOGRAM  03/04/2021  . TETANUS/TDAP  02/01/2028  . Hepatitis C Screening  Completed     Discussed health benefits of physical activity, and encouraged her to engage in regular exercise appropriate for her age and condition.    --------------------------------------------------------------------  1. Annual physical exam Colonoscopy due 2021. Pt sees Gyn for well woman exam. - TSH - Lipid panel - CBC w/Diff/Platelet - Comprehensive Metabolic Panel (CMET)  2. Screening for colon cancer  - Ambulatory referral to Gastroenterology  6 month follow up for medication.    Zarian Colpitts Cranford Mon, MD  [facility]

## 2019-03-18 ENCOUNTER — Ambulatory Visit (INDEPENDENT_AMBULATORY_CARE_PROVIDER_SITE_OTHER): Payer: BC Managed Care – PPO | Admitting: Family Medicine

## 2019-03-18 ENCOUNTER — Other Ambulatory Visit: Payer: Self-pay

## 2019-03-18 ENCOUNTER — Encounter: Payer: Self-pay | Admitting: Family Medicine

## 2019-03-18 VITALS — BP 109/74 | HR 69 | Temp 97.5°F | Resp 16 | Ht 67.0 in | Wt 160.0 lb

## 2019-03-18 DIAGNOSIS — Z1211 Encounter for screening for malignant neoplasm of colon: Secondary | ICD-10-CM | POA: Diagnosis not present

## 2019-03-18 DIAGNOSIS — F419 Anxiety disorder, unspecified: Secondary | ICD-10-CM

## 2019-03-18 DIAGNOSIS — Z0000 Encounter for general adult medical examination without abnormal findings: Secondary | ICD-10-CM

## 2019-03-19 ENCOUNTER — Ambulatory Visit
Admission: RE | Admit: 2019-03-19 | Discharge: 2019-03-19 | Disposition: A | Discharge status: Home / Self Care | Payer: BC Managed Care – PPO | Source: Ambulatory Visit | Attending: Obstetrics and Gynecology | Admitting: Obstetrics and Gynecology

## 2019-03-19 DIAGNOSIS — N6310 Unspecified lump in the right breast, unspecified quadrant: Secondary | ICD-10-CM | POA: Insufficient documentation

## 2019-03-19 DIAGNOSIS — R928 Other abnormal and inconclusive findings on diagnostic imaging of breast: Secondary | ICD-10-CM | POA: Diagnosis not present

## 2019-03-20 ENCOUNTER — Telehealth: Payer: Self-pay | Admitting: Obstetrics and Gynecology

## 2019-03-20 ENCOUNTER — Other Ambulatory Visit: Payer: Self-pay | Admitting: Anatomic Pathology & Clinical Pathology

## 2019-03-20 ENCOUNTER — Encounter: Payer: Self-pay | Admitting: Obstetrics and Gynecology

## 2019-03-20 ENCOUNTER — Other Ambulatory Visit: Payer: Self-pay

## 2019-03-20 DIAGNOSIS — C50919 Malignant neoplasm of unspecified site of unspecified female breast: Secondary | ICD-10-CM

## 2019-03-20 DIAGNOSIS — C50911 Malignant neoplasm of unspecified site of right female breast: Secondary | ICD-10-CM

## 2019-03-20 NOTE — Telephone Encounter (Signed)
Pt received breast bx results of invasive mammary carcinoma. Wants to go to [facility] for gen surg and Dr. Janese Banks for med onc. I am doing [facility] ref, Al Pimple at [facility] doing Dr. Janese Banks ref.

## 2019-03-20 NOTE — Progress Notes (Signed)
Introduced IT trainer to patient, and husband.  Discussed patient preferences for referrals , and notified Alicia Copland PA, patient's provider.  Referral made to Dr. Janese Banks for Med/Onc consult, and to [facility] for second opinion/surgical consult.

## 2019-03-22 LAB — LIPID PANEL
Chol/HDL Ratio: 3 ratio (ref 0.0–4.4)
Cholesterol, Total: 231 mg/dL — ABNORMAL HIGH (ref 100–199)
HDL: 78 mg/dL (ref >39)
LDL Chol Calc (NIH): 132 mg/dL — ABNORMAL HIGH (ref 0–99)
Triglycerides: 123 mg/dL (ref 0–149)
VLDL Cholesterol Cal: 21 mg/dL (ref 5–40)

## 2019-03-22 LAB — CBC WITH DIFFERENTIAL/PLATELET
Basophils Absolute: 0.1 10*3/uL (ref 0.0–0.2)
Basos: 1 %
EOS (ABSOLUTE): 0.1 10*3/uL (ref 0.0–0.4)
Eos: 3 %
Hematocrit: 39.7 % (ref 34.0–46.6)
Hemoglobin: 13.2 g/dL (ref 11.1–15.9)
Immature Grans (Abs): 0 10*3/uL (ref 0.0–0.1)
Immature Granulocytes: 0 %
Lymphocytes Absolute: 1.4 10*3/uL (ref 0.7–3.1)
Lymphs: 31 %
MCH: 30.7 pg (ref 26.6–33.0)
MCHC: 33.2 g/dL (ref 31.5–35.7)
MCV: 92 fL (ref 79–97)
Monocytes Absolute: 0.4 10*3/uL (ref 0.1–0.9)
Monocytes: 9 %
Neutrophils Absolute: 2.5 10*3/uL (ref 1.4–7.0)
Neutrophils: 56 %
Platelets: 228 10*3/uL (ref 150–450)
RBC: 4.3 x10E6/uL (ref 3.77–5.28)
RDW: 14.1 % (ref 11.7–15.4)
WBC: 4.5 10*3/uL (ref 3.4–10.8)

## 2019-03-22 LAB — COMPREHENSIVE METABOLIC PANEL
ALT: 24 IU/L (ref 0–32)
AST: 25 IU/L (ref 0–40)
Albumin/Globulin Ratio: 2.2 (ref 1.2–2.2)
Albumin: 4.7 g/dL (ref 3.8–4.9)
Alkaline Phosphatase: 56 IU/L (ref 39–117)
BUN/Creatinine Ratio: 20 (ref 9–23)
BUN: 18 mg/dL (ref 6–24)
Bilirubin Total: 0.6 mg/dL (ref 0.0–1.2)
CO2: 21 mmol/L (ref 20–29)
Calcium: 9.6 mg/dL (ref 8.7–10.2)
Chloride: 103 mmol/L (ref 96–106)
Creatinine, Ser: 0.88 mg/dL (ref 0.57–1.00)
GFR calc Af Amer: 83 mL/min/{1.73_m2} (ref >59)
GFR calc non Af Amer: 72 mL/min/{1.73_m2} (ref >59)
Globulin, Total: 2.1 g/dL (ref 1.5–4.5)
Glucose: 102 mg/dL — ABNORMAL HIGH (ref 65–99)
Potassium: 4.3 mmol/L (ref 3.5–5.2)
Sodium: 138 mmol/L (ref 134–144)
Total Protein: 6.8 g/dL (ref 6.0–8.5)

## 2019-03-22 LAB — TSH: TSH: 2.85 u[IU]/mL (ref 0.450–4.500)

## 2019-03-22 MED ORDER — ALPRAZOLAM 0.5 MG PO TABS: 0.5000 mg | ORAL_TABLET | Freq: Every evening | ORAL | 4 refills | Status: DC | PRN

## 2019-03-24 ENCOUNTER — Other Ambulatory Visit: Payer: Self-pay

## 2019-03-24 ENCOUNTER — Other Ambulatory Visit: Payer: Self-pay | Admitting: Anatomic Pathology & Clinical Pathology

## 2019-03-24 HISTORY — PX: BREAST BIOPSY: SHX20

## 2019-03-24 LAB — SURGICAL PATHOLOGY

## 2019-03-25 ENCOUNTER — Inpatient Hospital Stay: Payer: BC Managed Care – PPO | Attending: Oncology | Admitting: Oncology

## 2019-03-25 ENCOUNTER — Other Ambulatory Visit: Payer: Self-pay | Admitting: *Deleted

## 2019-03-25 ENCOUNTER — Other Ambulatory Visit: Payer: Self-pay

## 2019-03-25 ENCOUNTER — Inpatient Hospital Stay: Payer: BC Managed Care – PPO

## 2019-03-25 ENCOUNTER — Encounter: Payer: Self-pay | Admitting: Oncology

## 2019-03-25 VITALS — BP 116/89 | HR 81 | Temp 98.0°F | Ht 67.0 in | Wt 160.0 lb

## 2019-03-25 DIAGNOSIS — Z170 Estrogen receptor positive status [ER+]: Secondary | ICD-10-CM

## 2019-03-25 DIAGNOSIS — Z7189 Other specified counseling: Secondary | ICD-10-CM

## 2019-03-25 DIAGNOSIS — C50919 Malignant neoplasm of unspecified site of unspecified female breast: Secondary | ICD-10-CM

## 2019-03-25 DIAGNOSIS — Z87891 Personal history of nicotine dependence: Secondary | ICD-10-CM | POA: Diagnosis not present

## 2019-03-25 DIAGNOSIS — Z79899 Other long term (current) drug therapy: Secondary | ICD-10-CM | POA: Insufficient documentation

## 2019-03-25 DIAGNOSIS — Z803 Family history of malignant neoplasm of breast: Secondary | ICD-10-CM | POA: Insufficient documentation

## 2019-03-25 DIAGNOSIS — C50411 Malignant neoplasm of upper-outer quadrant of right female breast: Secondary | ICD-10-CM

## 2019-03-25 DIAGNOSIS — Z7982 Long term (current) use of aspirin: Secondary | ICD-10-CM | POA: Insufficient documentation

## 2019-03-25 NOTE — Research (Signed)
I met with patient todayat herappointment with Dr. Sigmund Hazel "Procurement of Human Biospecimens for the Discovery and Validation of Biomarkers for the Predication, Diagnosis and Management of Disease" sponsored by [employer]. Patient's husband was also present during conversation. Dr. Janese Banks introduced study to patient.  Ifurther explained the study to patient including purpose of the study, risks/benefits, participation requirements, voluntary participation and reimbursement provided by the study. Patient agreeable to participation. Patient signed consent and HIPPA for study, both IRB approved 10/29/2018.  Copy of signed forms given to patient. Per study guidelines, patient was asked ifshe had Novocain in the past week or had any history of cancer. Patient answered no to both of these questions. (If the patient were to have answered yes, she would be disqualified from study). Lab appointment wasalreadyscheduled for today [facility] visit for genetic testing.Blood collected for research studyduring this lab appointmentand patient was given gift card provided by [facility]. Patient was thanked for her participation.  Tonya Guzman Clinical oncology research associate 03/25/2019 12:23

## 2019-03-25 NOTE — Progress Notes (Signed)
Patient is here today to establish care for her right breast invasive carcinoma.

## 2019-03-26 ENCOUNTER — Encounter: Payer: Self-pay | Admitting: *Deleted

## 2019-03-26 ENCOUNTER — Other Ambulatory Visit: Payer: Self-pay | Admitting: *Deleted

## 2019-03-26 DIAGNOSIS — C50919 Malignant neoplasm of unspecified site of unspecified female breast: Secondary | ICD-10-CM

## 2019-03-27 ENCOUNTER — Encounter: Payer: Self-pay | Admitting: Oncology

## 2019-03-27 ENCOUNTER — Encounter: Payer: Self-pay | Admitting: Licensed Clinical Social Worker

## 2019-03-27 ENCOUNTER — Other Ambulatory Visit: Payer: Self-pay

## 2019-03-27 ENCOUNTER — Inpatient Hospital Stay (HOSPITAL_BASED_OUTPATIENT_CLINIC_OR_DEPARTMENT_OTHER): Payer: BC Managed Care – PPO | Admitting: Licensed Clinical Social Worker

## 2019-03-27 DIAGNOSIS — C50411 Malignant neoplasm of upper-outer quadrant of right female breast: Secondary | ICD-10-CM | POA: Insufficient documentation

## 2019-03-27 DIAGNOSIS — Z7189 Other specified counseling: Secondary | ICD-10-CM | POA: Insufficient documentation

## 2019-03-27 DIAGNOSIS — Z170 Estrogen receptor positive status [ER+]: Secondary | ICD-10-CM

## 2019-03-27 DIAGNOSIS — Z1379 Encounter for other screening for genetic and chromosomal anomalies: Secondary | ICD-10-CM

## 2019-03-27 DIAGNOSIS — C50911 Malignant neoplasm of unspecified site of right female breast: Secondary | ICD-10-CM | POA: Insufficient documentation

## 2019-03-27 DIAGNOSIS — Z803 Family history of malignant neoplasm of breast: Secondary | ICD-10-CM | POA: Insufficient documentation

## 2019-03-27 NOTE — Progress Notes (Signed)
REFERRING PROVIDER: Sindy Guadeloupe, MD [place],  [place] [address]  PRIMARY PROVIDER:  Jerrol Banana., MD  PRIMARY REASON FOR VISIT:  1. Malignant neoplasm of upper-outer quadrant of right breast in female, estrogen receptor positive ([place])   2. Family history of breast cancer     I connected with Tonya Guzman on 03/27/2019 at 2:30 PM EDT by Webex and verified that I am speaking with the correct person using two identifiers.    Patient location: work Provider location: [facility]  HISTORY OF PRESENT ILLNESS:   Tonya Guzman, a 58 y.o. female, was seen for a [place] cancer genetics consultation at the request of Dr. Janese Banks due to a personal and family history of cancer.  Tonya Guzman presents to clinic today to discuss the possibility of a hereditary predisposition to cancer, genetic testing, and to further clarify her future cancer risks, as well as potential cancer risks for family members.   In 2020, at the age of 1, Tonya Guzman was diagnosed with invasive mammary carcinoma of the right breast, ER/PR+,Her2-.. The treatment plan includes lumpectomy, radiation, and possible chemotherapy.   CANCER HISTORY:  Oncology History  Malignant neoplasm of upper-outer quadrant of right breast in female, estrogen receptor positive ([place])  03/25/2019 Cancer Staging   Staging form: Breast, AJCC 8th Edition - Clinical stage from 03/25/2019: Stage IA (cT1a, cN0, cM0, G1, ER+, PR+, HER2-) - Signed by Sindy Guadeloupe, MD on 03/27/2019   03/27/2019 Initial Diagnosis   Malignant neoplasm of upper-outer quadrant of right breast in female, estrogen receptor positive ([place])      RISK FACTORS:  Menarche was at age 27.  First live birth at age 17.  OCP use for approximately 0 years.  Ovaries intact: yes.  Hysterectomy: no.  Menopausal status: postmenopausal.  HRT use: 3 years. Colonoscopy: yes; normal. Mammogram within the last year: yes. Number of breast biopsies: 1. Up to date with pelvic  exams: yes. Any excessive radiation exposure in the past: no  Past Medical History:  Diagnosis Date  . Family history of breast cancer   . Fatigue   . Insomnia   . Night sweats     Past Surgical History:  Procedure Laterality Date  . CESAREAN SECTION    . COLONOSCOPY  2010   repeat due after 10 yrs  . FOOT SURGERY Right   . KIDNEY SURGERY      Social History   Socioeconomic History  . Marital status: Married    Spouse name: Not on file  . Number of children: Not on file  . Years of education: Not on file  . Highest education level: Not on file  Occupational History  . Not on file  Social Needs  . Financial resource strain: Not on file  . Food insecurity    Worry: Not on file    Inability: Not on file  . Transportation needs    Medical: Not on file    Non-medical: Not on file  Tobacco Use  . Smoking status: Former Smoker    Packs/day: 0.25    Years: 1.00    Pack years: 0.25    Types: Cigarettes    Quit date: 05/08/2012    Years since quitting: 6.8  . Smokeless tobacco: Never Used  Substance and Sexual Activity  . Alcohol use: Yes    Comment: 3 drinks a week  . Drug use: No  . Sexual activity: Not Currently    Birth control/protection: Post-menopausal  Lifestyle  . Physical activity    Days per week: 2 days    Minutes per session: 30 min  . Stress: Not on file  Relationships  . Social Herbalist on phone: Not on file    Gets together: Not on file    Attends religious service: Not on file    Active member of club or organization: Not on file    Attends meetings of clubs or organizations: Not on file    Relationship status: Not on file  Other Topics Concern  . Not on file  Social History Narrative  . Not on file     FAMILY HISTORY:  We obtained a detailed, 4-generation family history.  Significant diagnoses are listed below: Family History  Problem Relation Age of Onset  . Pneumonia Mother   . Hypertension Mother   . Anemia Mother   .  Hyperlipidemia Father   . Hypertension Father   . Heart disease Father   . Heart attack Father   . Cancer Father        vocal  . Hypertension Sister   . Breast cancer Sister        right breast  . Hypertension Sister   . Hepatitis C Brother   . Breast cancer Maternal Aunt 70  . Cancer Maternal Uncle        unk type  . Cancer Maternal Uncle        unk type  . Ovarian cancer Neg Hx   . Colon cancer Neg Hx   . Diabetes Neg Hx    Tonya Guzman has 1 son and 1 daughter, and 1 grandchild. She has 2 sisters and 2 brothers. One of her brothers passed away due to liver cancer. One of her sisters had breast cancer, unsure age of diagnosis.   Tonya Guzman mother is living at 23, no history of cancer. Patient had 5 maternal uncles, 1 maternal aunt. Her aunt had breast cancer in her 89s. No known cancers in maternal cousins. Maternal grandmother passed around the age of 63, grandfather passed at 31.   Tonya Guzman father passed at 44 due to a heart attack. He had vocal cord cancer as well. Patient had 1 paternal uncle who passed in his 47s due to a blood disorder. She does not have paternal first cousins. Paternal grandmother died young, unsure exact age, and grandfather died in his 78s due to a heart attack.  Tonya Guzman is unaware of previous family history of genetic testing for hereditary cancer risks. Patient's maternal ancestors are of Native [place] and [place] descent, and paternal ancestors are of Native American descent. There is no reported Ashkenazi Jewish ancestry. There is no known consanguinity.  GENETIC COUNSELING ASSESSMENT: Ms. [name] is a 58 y.o. female with a personal and family history which is somewhat suggestive of a hereditary cancer syndrome and predisposition to cancer. We, therefore, discussed and recommended the following at today's visit.   DISCUSSION: We discussed that 5 - 10% of breast cancer is hereditary, with most cases associated with BRCA1/BRCA2 mutations.  There are other genes  that can be associated with hereditary breast cancer syndromes.   We discussed that testing is beneficial for several reasons including surgical decision-making for breast cancer, knowing how to follow individuals after completing their treatment, and understand if other family members could be at risk for cancer and allow them to undergo genetic testing.   We reviewed the characteristics, features and inheritance patterns of hereditary cancer  syndromes. We also discussed genetic testing, including the appropriate family members to test, the process of testing, insurance coverage and turn-around-time for results. We discussed the implications of a negative, positive and/or variant of uncertain significant result. In order to get genetic test results in a timely manner so that Ms. Hard can use these genetic test results for surgical decisions, we recommended Ms. Bernardini pursue genetic testing for the [facility] Breast Cancer STAT Panel. Once complete, we recommend Ms. Trampe pursue reflex genetic testing to the Common Hereditary Cancers gene panel.   The STAT Breast cancer panel offered by [facility] includes sequencing and rearrangement analysis for the following 9 genes:  ATM, BRCA1, BRCA2, CDH1, CHEK2, PALB2, PTEN, STK11 and TP53.    The Common Hereditary Cancers Panel offered by [facility] includes sequencing and/or deletion duplication testing of the following 48 genes: APC, ATM, AXIN2, BARD1, BMPR1A, BRCA1, BRCA2, BRIP1, CDH1, CDKN2A (p14ARF), CDKN2A (p16INK4a), CKD4, CHEK2, CTNNA1, DICER1, EPCAM (Deletion/duplication testing only), GREM1 (promoter region deletion/duplication testing only), KIT, MEN1, MLH1, MSH2, MSH3, MSH6, MUTYH, NBN, NF1, NHTL1, PALB2, PDGFRA, PMS2, POLD1, POLE, PTEN, RAD50, RAD51C, RAD51D, RNF43, SDHB, SDHC, SDHD, SMAD4, SMARCA4. STK11, TP53, TSC1, TSC2, and VHL.  The following genes were evaluated for sequence changes only: SDHA and HOXB13 c.251G>A variant only.  Based on Ms. Medley's personal and  family history of cancer, she meets medical criteria for genetic testing. Despite that she meets criteria, she may still have an out of pocket cost.   PLAN: After considering the risks, benefits, and limitations, Ms. Way provided informed consent to pursue genetic testing and the blood sample was sent to [facility] for analysis of the Breast Cancer STAT Panel + Common Hereditary Cancers Panel. Results should be available within approximately 5-10 days' time, at which point they will be disclosed by telephone to Ms. Sekula, as will any additional recommendations warranted by these results. Ms. Remlinger will receive a summary of her genetic counseling visit and a copy of her results once available. This information will also be available in Epic.   Ms. Shvartsman questions were answered to her satisfaction today. Our contact information was provided should additional questions or concerns arise. Thank you for the referral and allowing [place] to share in the care of your patient.   Faith Rogue, MS, [facility] Genetic Counselor [email or web address] Phone: [phone]  The patient was seen for a total of 30 minutes in virtual genetic counseling.  Drs. Magrinat, Lindi Adie and/or Burr Medico were available for discussion regarding this case.   _______________________________________________________________________ For Office Staff:  Number of people involved in session: 1 Was an Intern/ student involved with case: no

## 2019-03-27 NOTE — Progress Notes (Signed)
Hematology/Oncology Consult note [facility] Telephone:([phone] Fax:([phone]) [phone]  Patient Care Team: Jerrol Banana., MD as PCP - General (Family Medicine)   Name of the patient: Tonya Guzman  [id number]  1960-11-03    Reason for referral-new diagnosis of right breast cancer   Referring physician-Dr. Rosanna Randy  Date of visit: 03/27/19   History of presenting illness- Patient is a 58 year old female with no significant comorbidities who underwent a routine bilateral screening mammogram in October 2020 which showed a possible asymmetry in the right breast.  Diagnostic mammogram and ultrasound showed 5 mm irregular mass with posterior acoustic shadowing at 12:30 position 6 cm from the nipple.  Ultrasound of the right axilla was negative for lymphadenopathy.  Patient underwent core biopsy which showed invasive mammary carcinoma 4 mm, grade 1, ER greater than 90% positive, PR 11 to 50% positive and HER-2/neu negative.  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  She denies any specific complaints at this time.  She is G2 P2 L2.  Did not use any hormonal contraceptive but is using vaginal estradiol for vaginal dryness.  Menopause 3 years ago.  Family history significant for breast cancer in her maternal aunt in her 59's as well as sister. Patient does not know if her sister underwent genetic testing.  Patient has not had any prior abnormal breast mammograms or biopsies.  ECOG PS- 0  Pain scale- 0   Review of systems- Review of Systems  Constitutional: Negative for chills, fever, malaise/fatigue and weight loss.  HENT: Negative for congestion, ear discharge and nosebleeds.   Eyes: Negative for blurred vision.  Respiratory: Negative for cough, hemoptysis, sputum production, shortness of breath and wheezing.   Cardiovascular: Negative for chest pain, palpitations, orthopnea and  claudication.  Gastrointestinal: Negative for abdominal pain, blood in stool, constipation, diarrhea, heartburn, melena, nausea and vomiting.  Genitourinary: Negative for dysuria, flank pain, frequency, hematuria and urgency.  Musculoskeletal: Negative for back pain, joint pain and myalgias.  Skin: Negative for rash.  Neurological: Negative for dizziness, tingling, focal weakness, seizures, weakness and headaches.  Endo/Heme/Allergies: Does not bruise/bleed easily.  Psychiatric/Behavioral: Negative for depression and suicidal ideas. The patient does not have insomnia.     No Known Allergies  Patient Active Problem List   Diagnosis Date Noted   Goals of care, counseling/discussion 03/27/2019   Malignant neoplasm of upper-outer quadrant of right breast in female, estrogen receptor positive ([place]) 03/27/2019   Menopausal vaginal dryness 04/18/2016   Menopause 04/18/2016     Past Medical History:  Diagnosis Date   Fatigue    Insomnia    Night sweats      Past Surgical History:  Procedure Laterality Date   CESAREAN SECTION     COLONOSCOPY  2010   repeat due after 10 yrs   FOOT SURGERY Right    KIDNEY SURGERY      Social History   Socioeconomic History   Marital status: Married    Spouse name: Not on file   Number of children: Not on file   Years of education: Not on file   Highest education level: Not on file  Occupational History   Not on file  Social Needs   Financial resource strain: Not on file   Food insecurity    Worry: Not on file    Inability: Not on file   Transportation needs    Medical: Not on  file    Non-medical: Not on file  Tobacco Use   Smoking status: Former Smoker    Packs/day: 0.25    Years: 1.00    Pack years: 0.25    Types: Cigarettes    Quit date: 05/08/2012    Years since quitting: 6.8   Smokeless tobacco: Never Used  Substance and Sexual Activity   Alcohol use: Yes    Comment: 3 drinks a week   Drug use: No    Sexual activity: Not Currently    Birth control/protection: Post-menopausal  Lifestyle   Physical activity    Days per week: 2 days    Minutes per session: 30 min   Stress: Not on file  Relationships   Social connections    Talks on phone: Not on file    Gets together: Not on file    Attends religious service: Not on file    Active member of club or organization: Not on file    Attends meetings of clubs or organizations: Not on file    Relationship status: Not on file   Intimate partner violence    Fear of current or ex partner: Not on file    Emotionally abused: Not on file    Physically abused: Not on file    Forced sexual activity: Not on file  Other Topics Concern   Not on file  Social History Narrative   Not on file     Family History  Problem Relation Age of Onset   Pneumonia Mother    Hypertension Mother    Anemia Mother    Hyperlipidemia Father    Hypertension Father    Heart disease Father    Heart attack Father    Cancer Father        vocal   Hypertension Sister    Breast cancer Sister        right breast   Hypertension Sister    Hepatitis C Brother    Breast cancer Maternal Aunt 24   Ovarian cancer Neg Hx    Colon cancer Neg Hx    Diabetes Neg Hx      Current Outpatient Medications:    ALPRAZolam (XANAX) 0.5 MG tablet, Take 1 tablet (0.5 mg total) by mouth at bedtime as needed., Disp: 30 tablet, Rfl: 4   aspirin EC 81 MG tablet, Take 81 mg by mouth daily., Disp: , Rfl:    estradiol (ESTRACE) 0.1 MG/GM vaginal cream, Place 1 Applicatorful vaginally at bedtime. Insert 1g nightly for 1 wk, then 1 g once weekly as maintenace, Disp: 42.5 g, Rfl: 1   Magnesium Chloride (MAGNESIUM DR PO), Take by mouth., Disp: , Rfl:    NASONEX 50 MCG/ACT nasal spray, Reported on 07/21/2015, Disp: , Rfl:    Physical exam:  Vitals:   03/25/19 1047  BP: 116/89  Pulse: 81  Temp: 98 F (36.7 C)  TempSrc: Tympanic  Weight: 160 lb (72.6 kg)    Height: 5' 7" (1.702 m)   Physical Exam Constitutional:      General: She is not in acute distress. HENT:     Head: Normocephalic and atraumatic.  Eyes:     Pupils: Pupils are equal, round, and reactive to light.  Neck:     Musculoskeletal: Normal range of motion.  Cardiovascular:     Rate and Rhythm: Normal rate and regular rhythm.     Heart sounds: Normal heart sounds.  Pulmonary:     Effort: Pulmonary effort is normal.  Breath sounds: Normal breath sounds.  Abdominal:     General: Bowel sounds are normal.     Palpations: Abdomen is soft.  Skin:    General: Skin is warm and dry.  Neurological:     Mental Status: She is alert and oriented to person, place, and time.     Breast exam performed in sitting and lying down position.  There is induration and bruising noted at the site of recent breast biopsy.  No other palpable masses in the right breast.  No palpable bilateral axillary adenopathy.  No palpable masses in the left breast.   CMP Latest Ref Rng & Units 03/21/2019  Glucose 65 - 99 mg/dL 102(H)  BUN 6 - 24 mg/dL 18  Creatinine 0.57 - 1.00 mg/dL 0.88  Sodium 134 - 144 mmol/L 138  Potassium 3.5 - 5.2 mmol/L 4.3  Chloride 96 - 106 mmol/L 103  CO2 20 - 29 mmol/L 21  Calcium 8.7 - 10.2 mg/dL 9.6  Total Protein 6.0 - 8.5 g/dL 6.8  Total Bilirubin 0.0 - 1.2 mg/dL 0.6  Alkaline Phos 39 - 117 IU/L 56  AST 0 - 40 IU/L 25  ALT 0 - 32 IU/L 24   CBC Latest Ref Rng & Units 03/21/2019  WBC 3.4 - 10.8 x10E3/uL 4.5  Hemoglobin 11.1 - 15.9 g/dL 13.2  Hematocrit 34.0 - 46.6 % 39.7  Platelets 150 - 450 x10E3/uL 228    No images are attached to the encounter.  US Breast Ltd Uni Right Inc Axilla  Result Date: 03/14/2019 CLINICAL DATA:  58 year old patient recalled from recent screening mammogram for evaluation of a possible right breast asymmetry. EXAM: DIGITAL DIAGNOSTIC RIGHT MAMMOGRAM WITH TOMO ULTRASOUND RIGHT BREAST COMPARISON:  March 05, 2019 ACR Breast Density  Category c: The breast tissue is heterogeneously dense, which may obscure small masses. FINDINGS: Spot compression views of the upper inner right breast confirm a 5 mm mass with indistinct margins. On physical exam, no mass is palpated in the upper slightly inner right breast. Targeted ultrasound is performed, showing a heterogeneously hypoechoic 5 x 5 x 5 mm irregular mass with posterior acoustic shadowing at 12:30 position 6 cm from the nipple. Mass margins are indistinct. Ultrasound of the right axilla is negative for lymphadenopathy. IMPRESSION: Suspicious 5 mm mass in the upper inner quadrant of the right breast. RECOMMENDATION: Ultrasound-guided core needle biopsy is recommended. The procedure was described to the patient and will be scheduled for her. I have discussed the findings and recommendations with the patient. If applicable, a reminder letter will be sent to the patient regarding the next appointment. BI-RADS CATEGORY  4: Suspicious. Electronically Signed   By: Curlene Dolphin M.D.   On: 03/14/2019 15:51   Mm Diag Breast Tomo Uni Right  Result Date: 03/14/2019 CLINICAL DATA:  58 year old patient recalled from recent screening mammogram for evaluation of a possible right breast asymmetry. EXAM: DIGITAL DIAGNOSTIC RIGHT MAMMOGRAM WITH TOMO ULTRASOUND RIGHT BREAST COMPARISON:  March 05, 2019 ACR Breast Density Category c: The breast tissue is heterogeneously dense, which may obscure small masses. FINDINGS: Spot compression views of the upper inner right breast confirm a 5 mm mass with indistinct margins. On physical exam, no mass is palpated in the upper slightly inner right breast. Targeted ultrasound is performed, showing a heterogeneously hypoechoic 5 x 5 x 5 mm irregular mass with posterior acoustic shadowing at 12:30 position 6 cm from the nipple. Mass margins are indistinct. Ultrasound of the right axilla is negative for lymphadenopathy.  IMPRESSION: Suspicious 5 mm mass in the upper inner  quadrant of the right breast. RECOMMENDATION: Ultrasound-guided core needle biopsy is recommended. The procedure was described to the patient and will be scheduled for her. I have discussed the findings and recommendations with the patient. If applicable, a reminder letter will be sent to the patient regarding the next appointment. BI-RADS CATEGORY  4: Suspicious. Electronically Signed   By: Curlene Dolphin M.D.   On: 03/14/2019 15:51   Mm 3d Screen Breast Bilateral  Result Date: 03/06/2019 CLINICAL DATA:  Screening. EXAM: DIGITAL SCREENING BILATERAL MAMMOGRAM WITH TOMO AND CAD COMPARISON:  Previous exam(s). ACR Breast Density Category c: The breast tissue is heterogeneously dense, which may obscure small masses. FINDINGS: In the right breast, a possible asymmetry warrants further evaluation. In the left breast, no findings suspicious for malignancy. Images were processed with CAD. IMPRESSION: Further evaluation is suggested for possible asymmetry in the right breast. RECOMMENDATION: Diagnostic mammogram and possibly ultrasound of the right breast. (Code:[id number]) The patient will be contacted regarding the findings, and additional imaging will be scheduled. BI-RADS CATEGORY  0: Incomplete. Need additional imaging evaluation and/or prior mammograms for comparison. Electronically Signed   By: Audie Pinto M.D.   On: 03/06/2019 10:25   Mm Clip Placement Right  Result Date: 03/19/2019 CLINICAL DATA:  Patient status post ultrasound-guided biopsy right breast mass. EXAM: DIAGNOSTIC RIGHT MAMMOGRAM POST ULTRASOUND BIOPSY COMPARISON:  Previous exam(s). FINDINGS: Mammographic images were obtained following ultrasound guided biopsy of right breast mass. The biopsy marking clip is in expected position at the site of biopsy. IMPRESSION: Appropriate positioning of the heart shaped biopsy marking clip at the site of biopsy in the right breast. Final Assessment: Post Procedure Mammograms for Marker Placement  Electronically Signed   By: Lovey Newcomer M.D.   On: 03/19/2019 09:35   [place] Rt Breast Bx W Loc Dev 1st Lesion Img Bx Spec US Guide  Addendum Date: 03/24/2019   ADDENDUM REPORT: 03/21/2019 14:00 ADDENDUM: PATHOLOGY revealed: A. BREAST, RIGHT, 12:30 O'CLOCK 6 CM FROM NIPPLE; ULTRASOUND-GUIDED CORE BIOPSY: - INVASIVE MAMMARY CARCINOMA, NO SPECIAL TYPE. 4 mm in this sample. Grade 1. 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 Lymphovascular invasion: Not identified. Pathology results are CONCORDANT with imaging findings, per Dr. Lovey Newcomer. Pathology results were discussed with patient via telephone. The patient reported doing well after the biopsy with no adverse symptoms, only tenderness at the site. Post biopsy care instructions were reviewed and questions were answered. The patient was encouraged to call [facility] for any additional questions or concerns. Recommendation: Surgical referral. Request for surgical referral was relayed to nurse navigators at [facility] by Electa Sniff RN on 03/20/2019. Addendum by Electa Sniff RN on 03/21/2019. Electronically Signed   By: Lovey Newcomer M.D.   On: 03/21/2019 14:00   Result Date: 03/24/2019 CLINICAL DATA:  Patient with indeterminate right breast mass 12:30 o'clock EXAM: ULTRASOUND GUIDED RIGHT BREAST CORE NEEDLE BIOPSY COMPARISON:  Previous exam(s). FINDINGS: I met with the patient and we discussed the procedure of ultrasound-guided biopsy, including benefits and alternatives. We discussed the high likelihood of a successful procedure. We discussed the risks of the procedure, including infection, bleeding, tissue injury, clip migration, and inadequate sampling. Informed written consent was given. The usual time-out protocol was performed immediately prior to the procedure. Lesion quadrant: Upper inner quadrant Using sterile technique and 1% Lidocaine as local anesthetic, under direct ultrasound  visualization, a 14 gauge spring-loaded device was  used to perform biopsy of right breast mass 12:30 o'clock using a medial approach. At the conclusion of the procedure heart shaped tissue marker clip was deployed into the biopsy cavity. Follow up 2 view mammogram was performed and dictated separately. IMPRESSION: Ultrasound guided biopsy of right breast mass 12:30 o'clock. No apparent complications. Electronically Signed: By: Lovey Newcomer M.D. On: 03/19/2019 09:36    Assessment and plan- Patient is a 58 y.o. female with newly diagnosed invasive mammary carcinoma of the right breast clinically prognostic stage Ia cT1 acN0 cM0 ER/PR positive HER-2/neu negative on core biopsy  I discussed the results of mammogram ultrasound and core biopsy with the patient in detail.  Routine screening mammogram picked up a 5 mm abnormal mass in the 1230 quadrant of the right breast which was grade 1 ER/PR positive HER-2/neu negative invasive mammary carcinoma.  First step would be lumpectomy and sentinel lymph node biopsy for which she would be seen Dr. Katheran James at [facility].  Discussed that there would be no definitive benefit of undergoing mastectomy in the setting and lumpectomy /sentinel lymph node biopsy followed by adjuvant radiation therapy is equivalent to mastectomy.  She will also have a radiation oncology visit at [facility] to discuss adjuvant radiation treatment.  If she needs 5 weeks of adjuvant radiation treatment patient would like to get it done here at [facility].  I will meet her again after her final pathology from surgery is back in about 1 month.  If final pathology shows grade 1 more than 10 mm tumor or grade 2 tumor greater than 5 mm, Oncotype testing would be indicated to decide if patient would benefit from adjuvant chemotherapy.  Discussed what Oncotype testing is and how the results are interpreted.  Since patient is more than 58 years of age and postmenopausal, if her Oncotype recurrence score falls in the  low risk group with a score of less than 11 or intermediate risk group with a score between 11-25 she would not benefit from adjuvant chemotherapy.  Adjuvant chemotherapy would only be indicated if her Oncotype score is 26 or greater.  Right axilla appeared normal on ultrasound but final pathology and sentinel lymph node biopsy will determine if lymph nodes are involved or not.  The likelihood that patient would need adjuvant chemotherapy would be low.  Given that her tumor is ER/PR positive adjuvant hormone therapy would be indicated.  Briefly discussed differences between tamoxifen and AI.  I would recommend 5 years of AI for her.  I will discuss hormone therapy in greater detail at her next visit. Treatment will be given with a curative intent.  Given history of breast cancer in her sister and maternal aunt I will refer her to genetics and sent her blood test today for [employer] genetic testing  I have also asked the patient to stop taking vaginal estradiol at this time.  Vaginal estradiol has variable systemic absorption but would not be recommended as such with new diagnosis of ER positive breast cancer  Cancer Staging Malignant neoplasm of upper-outer quadrant of right breast in female, estrogen receptor positive ([place]) Staging form: Breast, AJCC 8th Edition - Clinical stage from 03/25/2019: Stage IA (cT1a, cN0, cM0, G1, ER+, PR+, HER2-) - Signed by Sindy Guadeloupe, MD on 03/27/2019    Thank you for this kind referral and the opportunity to participate in the care of this patient   Visit Diagnosis 1. Malignant neoplasm of upper-outer quadrant of right breast in female, estrogen receptor positive ([place])  2. Goals of care, counseling/discussion     Dr. Randa Evens, MD, MPH [facility] at [facility] [phone] 03/27/2019  12:06 PM

## 2019-03-31 ENCOUNTER — Inpatient Hospital Stay: Payer: BC Managed Care – PPO

## 2019-04-02 ENCOUNTER — Telehealth: Payer: Self-pay | Admitting: Licensed Clinical Social Worker

## 2019-04-02 NOTE — Telephone Encounter (Signed)
Revealed negative genetic testing on the STAT Breast Cancer Panel (9 genes). We will reach out again once the remainder of her testing is complete.

## 2019-04-14 ENCOUNTER — Telehealth: Payer: Self-pay | Admitting: Licensed Clinical Social Worker

## 2019-04-14 ENCOUNTER — Ambulatory Visit: Payer: Self-pay | Admitting: Licensed Clinical Social Worker

## 2019-04-14 ENCOUNTER — Encounter: Payer: Self-pay | Admitting: Licensed Clinical Social Worker

## 2019-04-14 DIAGNOSIS — C50411 Malignant neoplasm of upper-outer quadrant of right female breast: Secondary | ICD-10-CM

## 2019-04-14 DIAGNOSIS — Z1379 Encounter for other screening for genetic and chromosomal anomalies: Secondary | ICD-10-CM | POA: Insufficient documentation

## 2019-04-14 DIAGNOSIS — Z803 Family history of malignant neoplasm of breast: Secondary | ICD-10-CM

## 2019-04-14 NOTE — Telephone Encounter (Signed)
Revealed negative genetic testing for the remainder of the panel.  Revealed that a VUS in CTNNA1 was identified. This normal result is reassuring and indicates that it is unlikely Tonya Guzman's cancer is due to a hereditary cause.  It is unlikely that there is an increased risk of another cancer due to a mutation in one of these genes.  However, genetic testing is not perfect, and cannot definitively rule out a hereditary cause.  It will be important for her to keep in contact with genetics to learn if any additional testing may be needed in the future.

## 2019-04-14 NOTE — Progress Notes (Signed)
HPI:  Tonya Guzman was previously seen in the [place] clinic due to a personal and family history of cancer and concerns regarding a hereditary predisposition to cancer. Please refer to our prior cancer genetics clinic note for more information regarding our discussion, assessment and recommendations, at the time. Tonya Guzman recent genetic test results were disclosed to her, as were recommendations warranted by these results. These results and recommendations are discussed in more detail below.  CANCER HISTORY:  Oncology History  Malignant neoplasm of upper-outer quadrant of right breast in female, estrogen receptor positive ([place])  03/25/2019 Cancer Staging   Staging form: Breast, AJCC 8th Edition - Clinical stage from 03/25/2019: Stage IA (cT1a, cN0, cM0, G1, ER+, PR+, HER2-) - Signed by Sindy Guadeloupe, MD on 03/27/2019   03/27/2019 Initial Diagnosis   Malignant neoplasm of upper-outer quadrant of right breast in female, estrogen receptor positive ([place])    Genetic Testing   No pathogenic variants identified. VUS in CTNNA1 called c.2671G>A identified on the [facility] Breast Cancer STAT Panel + Common Hereditary Cancers Panel. The final report date is 04/11/2019.  The STAT Breast cancer panel offered by [facility] includes sequencing and rearrangement analysis for the following 9 genes:  ATM, BRCA1, BRCA2, CDH1, CHEK2, PALB2, PTEN, STK11 and TP53.    The Common Hereditary Cancers Panel offered by [facility] includes sequencing and/or deletion duplication testing of the following 47genes: APC, ATM, AXIN2, BARD1, BMPR1A, BRCA1, BRCA2, BRIP1, CDH1, CDKN2A (p14ARF), CDKN2A (p16INK4a), CKD4, CHEK2, CTNNA1, DICER1, EPCAM (Deletion/duplication testing only), GREM1 (promoter region deletion/duplication testing only), KIT, MEN1, MLH1, MSH2, MSH3, MSH6, MUTYH, NBN, NF1, NHTL1, PALB2, PDGFRA, PMS2, POLD1, POLE, PTEN, RAD50, RAD51C, RAD51D, SDHB, SDHC, SDHD, SMAD4, SMARCA4. STK11, TP53, TSC1, TSC2, and  VHL.  The following genes were evaluated for sequence changes only: SDHA and HOXB13 c.251G>A variant only.     FAMILY HISTORY:  We obtained a detailed, 4-generation family history.  Significant diagnoses are listed below: Family History  Problem Relation Age of Onset   Pneumonia Mother    Hypertension Mother    Anemia Mother    Hyperlipidemia Father    Hypertension Father    Heart disease Father    Heart attack Father    Cancer Father        vocal   Hypertension Sister    Breast cancer Sister        right breast   Hypertension Sister    Hepatitis C Brother    Breast cancer Maternal Aunt 70   Cancer Maternal Uncle        unk type   Cancer Maternal Uncle        unk type   Ovarian cancer Neg Hx    Colon cancer Neg Hx    Diabetes Neg Hx     Tonya Guzman has 1 son and 1 daughter, and 1 grandchild. She has 2 sisters and 2 brothers. One of her brothers passed away due to liver cancer. One of her sisters had breast cancer, unsure age of diagnosis.   Tonya Guzman mother is living at 37, no history of cancer. Patient had 5 maternal uncles, 1 maternal aunt. Her aunt had breast cancer in her 46s. No known cancers in maternal cousins. Maternal grandmother passed around the age of 25, grandfather passed at 32.   Tonya Guzman father passed at 64 due to a heart attack. He had vocal cord cancer as well. Patient had 1 paternal uncle who passed in his 43s due to a  blood disorder. She does not have paternal first cousins. Paternal grandmother died young, unsure exact age, and grandfather died in his 54s due to a heart attack.  Tonya Guzman is unaware of previous family history of genetic testing for hereditary cancer risks. Patient's maternal ancestors are of Native [place] and [place] descent, and paternal ancestors are of Native American descent. There is no reported Ashkenazi Jewish ancestry. There is no known consanguinity.  GENETIC TEST RESULTS: Genetic testing reported out on  04/11/2019 through the [facility] Breast Cancer STAT Panel (reported 03/31/2019) + Common Hereditary cancer panel found no pathogenic mutations.   The STAT Breast cancer panel offered by [facility] includes sequencing and rearrangement analysis for the following 9 genes:  ATM, BRCA1, BRCA2, CDH1, CHEK2, PALB2, PTEN, STK11 and TP53.    The Common Hereditary Cancers Panel offered by [facility] includes sequencing and/or deletion duplication testing of the following 48 genes: APC, ATM, AXIN2, BARD1, BMPR1A, BRCA1, BRCA2, BRIP1, CDH1, CDKN2A (p14ARF), CDKN2A (p16INK4a), CKD4, CHEK2, CTNNA1, DICER1, EPCAM (Deletion/duplication testing only), GREM1 (promoter region deletion/duplication testing only), KIT, MEN1, MLH1, MSH2, MSH3, MSH6, MUTYH, NBN, NF1, NHTL1, PALB2, PDGFRA, PMS2, POLD1, POLE, PTEN, RAD50, RAD51C, RAD51D, RNF43, SDHB, SDHC, SDHD, SMAD4, SMARCA4. STK11, TP53, TSC1, TSC2, and VHL.  The following genes were evaluated for sequence changes only: SDHA and HOXB13 c.251G>A variant only.  The test report has been scanned into EPIC and is located under the Molecular Pathology section of the Results Review tab.  A portion of the Guzman report is included below for reference.     We discussed with Tonya Guzman that because current genetic testing is not perfect, it is possible there may be a gene mutation in one of these genes that current testing cannot detect, but that chance is small.  We also discussed, that there could be another gene that has not yet been discovered, or that we have not yet tested, that is responsible for the cancer diagnoses in the family. It is also possible there is a hereditary cause for the cancer in the family that Tonya Guzman did not inherit and therefore was not identified in her testing.  Therefore, it is important to remain in touch with cancer genetics in the future so that we can continue to offer Tonya Guzman the most up to date genetic testing.   Genetic testing did identify a variant of  uncertain significance (VUS) was identified in the CTNNA1 gene called c.2671G>A.  At this time, it is unknown if this variant is associated with increased cancer risk or if this is a normal finding, but most variants such as this get reclassified to being inconsequential. It should not be used to make medical management decisions. With time, we suspect the lab will determine the significance of this variant, if any. If we do learn more about it, we will try to contact Tonya Guzman to discuss it further. However, it is important to stay in touch with [place] periodically and keep the address and phone number up to date.  ADDITIONAL GENETIC TESTING: We discussed with Tonya Guzman that her genetic testing was fairly extensive.  If there are genes identified to increase cancer risk that can be analyzed in the future, we would be happy to discuss and coordinate this testing at that time.    CANCER SCREENING RECOMMENDATIONS: Tonya Guzman is considered negative (normal).  This means that we have not identified a hereditary cause for her  personal and family history of cancer at this time. Most  cancers happen by chance and this negative test suggests that her cancer may fall into this category.    While reassuring, this does not definitively rule out a hereditary predisposition to cancer. It is still possible that there could be genetic mutations that are undetectable by current technology. There could be genetic mutations in genes that have not been tested or identified to increase cancer risk.  Therefore, it is recommended she continue to follow the cancer management and screening guidelines provided by her oncology and primary healthcare provider.   An individual's cancer risk and medical management are not determined by genetic test results alone. Overall cancer risk assessment incorporates additional factors, including personal medical history, family history, and any available genetic information that may Guzman  in a personalized plan for cancer prevention and surveillance.  RECOMMENDATIONS FOR FAMILY MEMBERS:  Relatives in this family might be at some increased risk of developing cancer, over the general population risk, simply due to the family history of cancer.  We recommended female relatives in this family have a yearly mammogram beginning at age 39, or 73 years younger than the earliest onset of cancer, an annual clinical breast exam, and perform monthly breast self-exams. Female relatives in this family should also have a gynecological exam as recommended by their primary provider. All family members should have a colonoscopy by age 49, or as directed by their physicians.  FOLLOW-UP: Lastly, we discussed with Ms. Cafarelli that cancer genetics is a rapidly advancing field and it is possible that new genetic tests will be appropriate for her and/or her family members in the future. We encouraged her to remain in contact with cancer genetics on an annual basis so we can update her personal and family histories and let her know of advances in cancer genetics that may benefit this family.   Our contact number was provided. Ms. Duggar questions were answered to her satisfaction, and she knows she is welcome to call us at anytime with additional questions or concerns.   Faith Rogue, MS, [facility] Genetic Counselor [email or web address] Phone: [phone]

## 2019-04-17 ENCOUNTER — Ambulatory Visit: Payer: 59 | Admitting: Oncology

## 2019-04-24 ENCOUNTER — Ambulatory Visit: Payer: 59 | Admitting: Oncology

## 2019-05-13 ENCOUNTER — Telehealth: Payer: Self-pay

## 2019-05-13 NOTE — Telephone Encounter (Signed)
-----   Message from Vanetta Mulders, [place] sent at 03/26/2019 11:24 AM EST ----- Regarding: Colonoscopy January 2021 Request colonoscopy in January 2021 due to recent diagnosis of breast cancer.  Thanks [employer]

## 2019-05-13 NOTE — Telephone Encounter (Signed)
Attempted to contact patient in regard to schedule her colonoscopy.  She had requested a call back in January.  I was unable to contact her due to [place] did not give the option to LVM.  Thanks [employer]

## 2019-05-22 ENCOUNTER — Inpatient Hospital Stay: Payer: BC Managed Care – PPO | Admitting: Oncology

## 2019-08-16 ENCOUNTER — Other Ambulatory Visit: Payer: Self-pay | Admitting: Family Medicine

## 2019-08-16 DIAGNOSIS — F419 Anxiety disorder, unspecified: Secondary | ICD-10-CM

## 2019-08-16 NOTE — Telephone Encounter (Signed)
Requested medication (s) are due for refill today: yes  Requested medication (s) are on the active medication list: yes  Last refill:  03/22/19  Future visit scheduled: no  Notes to clinic:  medication not delegated to NT to refill   Requested Prescriptions  Pending Prescriptions Disp Refills   ALPRAZolam (XANAX) 0.5 MG tablet [Pharmacy Med Name: ALPRAZOLAM 0.5 MG TAB] 30 tablet     Sig: TAKE 1 TABLET BY MOUTH NIGHTLY AS NEEDED      Not Delegated - Psychiatry:  Anxiolytics/Hypnotics Failed - 08/16/2019  8:26 AM      Failed - This refill cannot be delegated      Failed - Urine Drug Screen completed in last 360 days.      Failed - Valid encounter within last 6 months    Recent Outpatient Visits           5 months ago Annual physical exam   [facility] Jerrol Banana., MD   8 months ago    [facility] Rosanna Randy, Retia Passe., MD   1 year ago Annual physical exam   [facility] Jerrol Banana., MD   2 years ago Herpes zoster without complication   [facility] Jerrol Banana., MD   2 years ago Annual physical exam   [facility] Jerrol Banana., MD       Future Appointments             In 1 month Jerrol Banana., MD [facility], [place]

## 2019-08-18 NOTE — Telephone Encounter (Signed)
Please advise? LOV 03/18/19, has upcoming appointment on 09/15/19.

## 2019-09-11 NOTE — Progress Notes (Signed)
Trena Platt Cummings,acting as a scribe for Wilhemena Durie, MD.,have documented all relevant documentation on the behalf of Wilhemena Durie, MD,as directed by  Wilhemena Durie, MD while in the presence of Wilhemena Durie, MD.  Established patient visit   Patient: Tonya Guzman   DOB: 1961-04-16   59 y.o. Female  MRN: [id number] Visit Date: 09/15/2019  Today's healthcare provider: Wilhemena Durie, MD   Chief Complaint  Patient presents with  . Medication Refill   Subjective    HPI   Patient is here for 6 month follow up. Had labs checked 03/20/2020 showing-Labs stable--advised patient to continue to work on diet and exercise.  She is doing surprisingly well.  Since her last visit with me she has had breast cancer treated at [facility].  Her mother-in-law has become ill and her mother moved out of the patient's home and moved in with her brother.  She is okay with all this and is coping well.  She is enjoying being in a grandmother with her sons infant son.      Medications: Outpatient Medications Prior to Visit  Medication Sig  . ALPRAZolam (XANAX) 0.5 MG tablet TAKE 1 TABLET BY MOUTH NIGHTLY AS NEEDED  . aspirin EC 81 MG tablet Take 81 mg by mouth daily.  Marland Kitchen letrozole (FEMARA) 2.5 MG tablet Take 2.5 mg by mouth daily.  . Magnesium Chloride (MAGNESIUM DR PO) Take by mouth.  Marland Kitchen NASONEX 50 MCG/ACT nasal spray Reported on 07/21/2015  . estradiol (ESTRACE) 0.1 MG/GM vaginal cream Place 1 Applicatorful vaginally at bedtime. Insert 1g nightly for 1 wk, then 1 g once weekly as maintenace   No facility-administered medications prior to visit.    Review of Systems  Constitutional: Negative for appetite change, chills, fatigue and fever.  Respiratory: Negative for chest tightness and shortness of breath.   Cardiovascular: Negative for chest pain and palpitations.  Gastrointestinal: Negative for abdominal pain, nausea and vomiting.  Neurological: Negative for dizziness and  weakness.       Objective    BP 109/72 (BP Location: Right Arm, Patient Position: Sitting, Cuff Size: Normal)   Pulse 73   Temp (!) 97.5 F (36.4 C) (Temporal)   Ht 5\' 7"  (1.702 m)   Wt 156 lb 3.2 oz (70.9 kg)   BMI 24.46 kg/m  Wt Readings from Last 3 Encounters:  09/15/19 156 lb 3.2 oz (70.9 kg)  03/25/19 160 lb (72.6 kg)  03/18/19 160 lb (72.6 kg)      Physical Exam Vitals reviewed.  Constitutional:      Appearance: She is well-developed.  HENT:     Head: Normocephalic and atraumatic.  Eyes:     General: No scleral icterus.    Conjunctiva/sclera: Conjunctivae normal.  Neck:     Thyroid: No thyromegaly.  Cardiovascular:     Rate and Rhythm: Normal rate and regular rhythm.     Heart sounds: Normal heart sounds.  Pulmonary:     Effort: Pulmonary effort is normal.     Breath sounds: Normal breath sounds.  Abdominal:     Palpations: Abdomen is soft.  Musculoskeletal:     Right lower leg: No edema.     Left lower leg: No edema.  Skin:    General: Skin is warm and dry.  Neurological:     General: No focal deficit present.     Mental Status: She is alert and oriented to person, place, and time.  Psychiatric:  Mood and Affect: Mood normal.        Behavior: Behavior normal.        Thought Content: Thought content normal.        Judgment: Judgment normal.       No results found for any visits on 09/15/19.  Assessment & Plan     1. Anxiety Clinically patient doing very well.  She enjoys her job and feels good about everything. She states she still use Xanax for sleeping pill.  2. Malignant neoplasm of upper-outer quadrant of right breast in female, estrogen receptor positive ([place]) Followed at [facility].  She is doing very well on letrozole although it makes her irritable   No follow-ups on file.      I, Wilhemena Durie, MD, have reviewed all documentation for this visit. The documentation on 09/16/19 for the exam, diagnosis, procedures, and orders are  all accurate and complete.    Anshi Jalloh Cranford Mon, MD  [facility] [phone] (phone) [phone] (fax)  [place]

## 2019-09-15 ENCOUNTER — Encounter: Payer: Self-pay | Admitting: Family Medicine

## 2019-09-15 ENCOUNTER — Other Ambulatory Visit: Payer: Self-pay

## 2019-09-15 ENCOUNTER — Ambulatory Visit (INDEPENDENT_AMBULATORY_CARE_PROVIDER_SITE_OTHER): Payer: BC Managed Care – PPO | Admitting: Family Medicine

## 2019-09-15 VITALS — BP 109/72 | HR 73 | Temp 97.5°F | Ht 67.0 in | Wt 156.2 lb

## 2019-09-15 DIAGNOSIS — C50411 Malignant neoplasm of upper-outer quadrant of right female breast: Secondary | ICD-10-CM

## 2019-09-15 DIAGNOSIS — Z170 Estrogen receptor positive status [ER+]: Secondary | ICD-10-CM | POA: Diagnosis not present

## 2019-09-15 DIAGNOSIS — F419 Anxiety disorder, unspecified: Secondary | ICD-10-CM

## 2019-09-15 NOTE — Patient Instructions (Signed)
TRY OTC ROBITUSSIN AM/PM !!!!

## 2019-11-13 ENCOUNTER — Other Ambulatory Visit: Payer: Self-pay | Admitting: Family Medicine

## 2019-11-13 DIAGNOSIS — F419 Anxiety disorder, unspecified: Secondary | ICD-10-CM

## 2019-12-16 ENCOUNTER — Other Ambulatory Visit: Payer: Self-pay | Admitting: Family Medicine

## 2019-12-16 DIAGNOSIS — F419 Anxiety disorder, unspecified: Secondary | ICD-10-CM

## 2019-12-16 NOTE — Telephone Encounter (Signed)
Requested medication (s) are due for refill today: Due 12/18/19  Requested medication (s) are on the active medication list: yes  Last refill: 08/18/19  #  30  3 refills  Future visit scheduled yes  02/05/20  Notes to clinic: not delegated  Requested Prescriptions  Pending Prescriptions Disp Refills   ALPRAZolam (XANAX) 0.5 MG tablet [Pharmacy Med Name: ALPRAZOLAM 0.5 MG TAB] 30 tablet     Sig: TAKE 1 TABLET BY MOUTH NIGHTLY AS NEEDED      Not Delegated - Psychiatry:  Anxiolytics/Hypnotics Failed - 12/16/2019 12:32 PM      Failed - This refill cannot be delegated      Failed - Urine Drug Screen completed in last 360 days.      Passed - Valid encounter within last 6 months    Recent Outpatient Visits           3 months ago [place] Jerrol Banana., [place]   9 months ago Annual physical exam   [facility] Jerrol Banana., MD   1 year ago    [facility] Jerrol Banana., MD   1 year ago Annual physical exam   [facility] Jerrol Banana., MD   2 years ago Herpes zoster without complication   [facility] Jerrol Banana., MD       Future Appointments             In 1 month Jerrol Banana., MD [facility], [place]

## 2019-12-16 NOTE — Telephone Encounter (Signed)
Please advise 

## 2020-01-29 ENCOUNTER — Other Ambulatory Visit: Payer: Self-pay

## 2020-01-29 ENCOUNTER — Other Ambulatory Visit (HOSPITAL_COMMUNITY)
Admission: RE | Admit: 2020-01-29 | Discharge: 2020-01-29 | Disposition: A | Discharge status: Home / Self Care | Payer: BC Managed Care – PPO | Source: Ambulatory Visit | Attending: Obstetrics and Gynecology | Admitting: Obstetrics and Gynecology

## 2020-01-29 ENCOUNTER — Ambulatory Visit (INDEPENDENT_AMBULATORY_CARE_PROVIDER_SITE_OTHER): Payer: BC Managed Care – PPO | Admitting: Obstetrics and Gynecology

## 2020-01-29 ENCOUNTER — Encounter: Payer: Self-pay | Admitting: Obstetrics and Gynecology

## 2020-01-29 VITALS — BP 112/70 | HR 80 | Resp 18 | Ht 67.0 in | Wt 157.6 lb

## 2020-01-29 DIAGNOSIS — Z1151 Encounter for screening for human papillomavirus (HPV): Secondary | ICD-10-CM | POA: Insufficient documentation

## 2020-01-29 DIAGNOSIS — Z01419 Encounter for gynecological examination (general) (routine) without abnormal findings: Secondary | ICD-10-CM

## 2020-01-29 DIAGNOSIS — Z124 Encounter for screening for malignant neoplasm of cervix: Secondary | ICD-10-CM | POA: Insufficient documentation

## 2020-01-29 DIAGNOSIS — C50911 Malignant neoplasm of unspecified site of right female breast: Secondary | ICD-10-CM

## 2020-01-29 DIAGNOSIS — Z803 Family history of malignant neoplasm of breast: Secondary | ICD-10-CM

## 2020-01-29 DIAGNOSIS — Z1231 Encounter for screening mammogram for malignant neoplasm of breast: Secondary | ICD-10-CM

## 2020-01-29 DIAGNOSIS — Z170 Estrogen receptor positive status [ER+]: Secondary | ICD-10-CM

## 2020-01-29 DIAGNOSIS — N951 Menopausal and female climacteric states: Secondary | ICD-10-CM

## 2020-01-29 DIAGNOSIS — Z1211 Encounter for screening for malignant neoplasm of colon: Secondary | ICD-10-CM

## 2020-01-29 NOTE — Patient Instructions (Signed)
I value your feedback and entrusting us with your care. If you get a [facility] patient survey, I would appreciate you taking the time to let us know about your experience today. Thank you!  As of April 17, 2019, your lab results will be released to your MyChart immediately, before I even have a chance to see them. Please give me time to review them and contact you if there are any abnormalities. Thank you for your patience.  

## 2020-01-29 NOTE — Progress Notes (Signed)
PCP: Jerrol Banana., MD   Chief Complaint  Patient presents with   Gynecologic Exam    annual exam    HPI:      [name] is a 59 y.o. [phone] who LMP was No LMP recorded. Patient is postmenopausal., presents today for her annual examination.  Her menses are absent due to menopause (2015). No pelvic pain or PMB. Occas vasomotor sx.    Sex activity--very limited due to dyspareunia: single partner, contraception - post menopausal status. She does have vaginal dryness. She used estrace crm in the past and liked that better than intrarosa, but can't use either anymore due to breast cancer dx last yr. Has tried coconut oil, too   Last Pap: April 18, 2016  Results were: no abnormalities /neg HPV DNA.   Last mammogram: 03/14/19  Results were: cat 4 RT breast. 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 Doing well There is a FH of breast cancer in her mat aunt and sister, pt is gene panel neg. There is no FH of ovarian cancer. The patient does do self-breast exams.  Colonoscopy: age 84 at [facility]; Repeat due after 10 years. Not scheduled yet.  Tobacco use: The patient denies current or previous tobacco use. Alcohol use: social drinker  No drug use. Exercise: moderately active  DEXA 2/21 at [facility] with oncology: osteopenia, on femara.  She does get adequate calcium and Vitamin D in her diet.  Labs with PCP.   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   Family history of breast cancer    Fatigue    Insomnia    Night sweats     Past Surgical History:  Procedure Laterality Date   CESAREAN SECTION     COLONOSCOPY  2010   repeat due after 10 yrs   FOOT SURGERY Right    KIDNEY SURGERY      Family History  Problem Relation Age of Onset   Pneumonia Mother    Hypertension Mother    Anemia Mother    Hyperlipidemia Father     Hypertension Father    Heart disease Father    Heart attack Father    Cancer Father        vocal   Hypertension Sister    Breast cancer Sister        right breast   Hypertension Sister    Hepatitis C Brother    Breast cancer Maternal Aunt 86   Cancer Maternal Uncle        unk type   Cancer Maternal Uncle        unk type   Ovarian cancer Neg Hx    Colon cancer Neg Hx    Diabetes Neg Hx     Social History   Socioeconomic History   Marital status: Married    Spouse name: Not on file   Number of children: Not on file   Years of education: Not on file   Highest education level: Not on file  Occupational History   Not on file  Tobacco Use   Smoking status: Former Smoker    Packs/day: 0.25    Years: 1.00    Pack years: 0.25    Types: Cigarettes    Quit date: 05/08/2012    Years since quitting: 7.7   Smokeless tobacco: Never Used  Media planner  Vaping Use: Never used  Substance and Sexual Activity   Alcohol use: Yes    Comment: 3 drinks a week   Drug use: No   Sexual activity: Not Currently    Birth control/protection: Post-menopausal  Other Topics Concern   Not on file  Social History Narrative   Not on file   Social Determinants of Health   Financial Resource Strain:    Difficulty of Paying Living Expenses: Not on file  Food Insecurity:    Worried About Commerce in the Last Year: Not on file   Ran Out of Food in the Last Year: Not on file  Transportation Needs:    Lack of Transportation (Medical): Not on file   Lack of Transportation (Non-Medical): Not on file  Physical Activity:    Days of Exercise per Week: Not on file   Minutes of Exercise per Session: Not on file  Stress:    Feeling of Stress : Not on file  Social Connections:    Frequency of Communication with Friends and Family: Not on file   Frequency of Social Gatherings with Friends and Family: Not on file   Attends Religious Services: Not on file     Active Member of Clubs or Organizations: Not on file   Attends Archivist Meetings: Not on file   Marital Status: Not on file  Intimate Partner Violence:    Fear of Current or Ex-Partner: Not on file   Emotionally Abused: Not on file   Physically Abused: Not on file   Sexually Abused: Not on file     Current Outpatient Medications:    ALPRAZolam (XANAX) 0.5 MG tablet, TAKE 1 TABLET BY MOUTH NIGHTLY AS NEEDED, Disp: 30 tablet, Rfl: 3   aspirin EC 81 MG tablet, Take 81 mg by mouth daily., Disp: , Rfl:    letrozole (FEMARA) 2.5 MG tablet, Take 2.5 mg by mouth daily., Disp: , Rfl:    Magnesium Chloride (MAGNESIUM DR PO), Take by mouth., Disp: , Rfl:    NASONEX 50 MCG/ACT nasal spray, Reported on 07/21/2015, Disp: , Rfl:      ROS:  Review of Systems  Constitutional: Negative for fatigue, fever and unexpected weight change.  Respiratory: Negative for cough, shortness of breath and wheezing.   Cardiovascular: Negative for chest pain, palpitations and leg swelling.  Gastrointestinal: Negative for blood in stool, constipation, diarrhea, nausea and vomiting.  Endocrine: Negative for cold intolerance, heat intolerance and polyuria.  Genitourinary: Positive for dyspareunia. Negative for dysuria, flank pain, frequency, genital sores, hematuria, menstrual problem, pelvic pain, urgency, vaginal bleeding, vaginal discharge and vaginal pain.  Musculoskeletal: Negative for back pain, joint swelling and myalgias.  Skin: Negative for rash.  Neurological: Negative for dizziness, syncope, light-headedness, numbness and headaches.  Hematological: Negative for adenopathy.  Psychiatric/Behavioral: Negative for agitation, confusion, sleep disturbance and suicidal ideas. The patient is not nervous/anxious.    BREAST: No symptoms    Objective: BP 112/70    Pulse 80    Resp 18    Ht 5' 7"  (1.702 m)    Wt 157 lb 9.6 oz (71.5 kg)    SpO2 97%    BMI 24.68 kg/m    Physical  Exam Constitutional:      Appearance: She is well-developed.  Genitourinary:     Vulva, vagina, cervix, uterus, right adnexa and left adnexa normal.     No vulval lesion or tenderness noted.     No vaginal discharge, erythema or tenderness.  No cervical polyp.     Uterus is not enlarged or tender.     No right or left adnexal mass present.     Right adnexa not tender.     Left adnexa not tender.     Genitourinary Comments: VAG TISSUE LOOKS FAIRLY HEALTHY OVERALL; MINIMAL ATROPHY; NO PAIN WITH EXAM  Neck:     Thyroid: No thyromegaly.  Cardiovascular:     Rate and Rhythm: Normal rate and regular rhythm.     Heart sounds: Normal heart sounds. No murmur heard.   Pulmonary:     Effort: Pulmonary effort is normal.     Breath sounds: Normal breath sounds.  Chest:     Breasts:        Right: No mass, nipple discharge, skin change or tenderness.        Left: No mass, nipple discharge, skin change or tenderness.  Abdominal:     Palpations: Abdomen is soft.     Tenderness: There is no abdominal tenderness. There is no guarding.  Musculoskeletal:        General: Normal range of motion.     Cervical back: Normal range of motion.  Neurological:     General: No focal deficit present.     Mental Status: She is alert and oriented to person, place, and time.     Cranial Nerves: No cranial nerve deficit.  Skin:    General: Skin is warm and dry.  Psychiatric:        Mood and Affect: Mood normal.        Behavior: Behavior normal.        Thought Content: Thought content normal.        Judgment: Judgment normal.  Vitals reviewed.     Assessment/Plan: Encounter for annual routine gynecological examination  Cervical cancer screening - Plan: Cytology - PAP  Screening for HPV (human papillomavirus) - Plan: Cytology - PAP  Encounter for screening mammogram for malignant neoplasm of breast--mammo sched by [facility].   Malignant neoplasm of right breast in female, estrogen receptor positive,  unspecified site of breast ([place]); doing well on femara. F/u prn PMB.  Family history of breast cancer--Pt is gene panel neg at [facility].  Screening for colon cancer - Plan: Ambulatory referral to Gastroenterology; refer to [facility] for scr colonoscopy due to age  Vaginal dryness, menopausal--try sliquid/coconut oil. Can't have vag ERT anymore.         GYN counsel breast self exam, mammography screening, menopause, adequate intake of calcium and vitamin D, diet and exercise    F/U  Return in about 1 year (around 01/28/2021).  Mariaguadalupe Fialkowski B. Yenesis Even, PA-C 01/29/2020 4:26 PM

## 2020-02-02 LAB — CYTOLOGY - PAP
Comment: NEGATIVE
Diagnosis: NEGATIVE
High risk HPV: NEGATIVE

## 2020-02-05 ENCOUNTER — Encounter: Payer: Self-pay | Admitting: Family Medicine

## 2020-02-05 ENCOUNTER — Other Ambulatory Visit: Payer: Self-pay

## 2020-02-05 ENCOUNTER — Ambulatory Visit (INDEPENDENT_AMBULATORY_CARE_PROVIDER_SITE_OTHER): Payer: BC Managed Care – PPO | Admitting: Family Medicine

## 2020-02-05 VITALS — BP 99/76 | HR 82 | Temp 98.6°F | Resp 16 | Ht 67.0 in | Wt 156.4 lb

## 2020-02-05 DIAGNOSIS — Z0000 Encounter for general adult medical examination without abnormal findings: Secondary | ICD-10-CM

## 2020-02-05 DIAGNOSIS — Z170 Estrogen receptor positive status [ER+]: Secondary | ICD-10-CM

## 2020-02-05 DIAGNOSIS — Z1389 Encounter for screening for other disorder: Secondary | ICD-10-CM | POA: Diagnosis not present

## 2020-02-05 DIAGNOSIS — C50411 Malignant neoplasm of upper-outer quadrant of right female breast: Secondary | ICD-10-CM | POA: Diagnosis not present

## 2020-02-05 DIAGNOSIS — Z13220 Encounter for screening for lipoid disorders: Secondary | ICD-10-CM | POA: Diagnosis not present

## 2020-02-05 LAB — POCT URINALYSIS DIPSTICK
Bilirubin, UA: NEGATIVE
Blood, UA: NEGATIVE
Glucose, UA: NEGATIVE
Ketones, UA: NEGATIVE
Leukocytes, UA: NEGATIVE
Nitrite, UA: NEGATIVE
Protein, UA: NEGATIVE
Spec Grav, UA: 1.01 (ref 1.010–1.025)
Urobilinogen, UA: 0.2 E.U./dL
pH, UA: 5 (ref 5.0–8.0)

## 2020-02-05 NOTE — Patient Instructions (Addendum)
Health Maintenance, Female Adopting a healthy lifestyle and getting preventive care are important in promoting health and wellness. Ask your health care provider about:  The right schedule for you to have regular tests and exams.  Things you can do on your own to prevent diseases and keep yourself healthy. What should I know about diet, weight, and exercise? Eat a healthy diet   Eat a diet that includes plenty of vegetables, fruits, low-fat dairy products, and lean protein.  Do not eat a lot of foods that are high in solid fats, added sugars, or sodium. Maintain a healthy weight Body mass index (BMI) is used to identify weight problems. It estimates body fat based on height and weight. Your health care provider can help determine your BMI and help you achieve or maintain a healthy weight. Get regular exercise Get regular exercise. This is one of the most important things you can do for your health. Most adults should:  Exercise for at least 150 minutes each week. The exercise should increase your heart rate and make you sweat (moderate-intensity exercise).  Do strengthening exercises at least twice a week. This is in addition to the moderate-intensity exercise.  Spend less time sitting. Even light physical activity can be beneficial. Watch cholesterol and blood lipids Have your blood tested for lipids and cholesterol at 61 years of age, then have this test every 5 years. Have your cholesterol levels checked more often if:  Your lipid or cholesterol levels are high.  You are older than 61 years of age.  You are at high risk for heart disease. What should I know about cancer screening? Depending on your health history and family history, you may need to have cancer screening at various ages. This may include screening for:  Breast cancer.  Cervical cancer.  Colorectal cancer.  Skin cancer.  Lung cancer. What should I know about heart disease, diabetes, and high blood  pressure? Blood pressure and heart disease  High blood pressure causes heart disease and increases the risk of stroke. This is more likely to develop in people who have high blood pressure readings, are of African descent, or are overweight.  Have your blood pressure checked: ? Every 3-5 years if you are 54-9 years of age. ? Every year if you are 69 years old or older. Diabetes Have regular diabetes screenings. This checks your fasting blood sugar level. Have the screening done:  Once every three years after age 36 if you are at a normal weight and have a low risk for diabetes.  More often and at a younger age if you are overweight or have a high risk for diabetes. What should I know about preventing infection? Hepatitis B If you have a higher risk for hepatitis B, you should be screened for this virus. Talk with your health care provider to find out if you are at risk for hepatitis B infection. Hepatitis C Testing is recommended for:  Everyone born from [birth year] through 1967.  Anyone with known risk factors for hepatitis C. Sexually transmitted infections (STIs)  Get screened for STIs, including gonorrhea and chlamydia, if: ? You are sexually active and are younger than 61 years of age. ? You are older than 61 years of age and your health care provider tells you that you are at risk for this type of infection. ? Your sexual activity has changed since you were last screened, and you are at increased risk for chlamydia or gonorrhea. Ask your health care provider  if you are at risk.  Ask your health care provider about whether you are at high risk for HIV. Your health care provider may recommend a prescription medicine to help prevent HIV infection. If you choose to take medicine to prevent HIV, you should first get tested for HIV. You should then be tested every 3 months for as [place] as you are taking the medicine. Pregnancy  If you are about to stop having your period (premenopausal) and  you may become pregnant, seek counseling before you get pregnant.  Take 400 to 800 micrograms (mcg) of folic acid every day if you become pregnant.  Ask for birth control (contraception) if you want to prevent pregnancy. Osteoporosis and menopause Osteoporosis is a disease in which the bones lose minerals and strength with aging. This can result in bone fractures. If you are 11 years old or older, or if you are at risk for osteoporosis and fractures, ask your health care provider if you should:  Be screened for bone loss.  Take a calcium or vitamin D supplement to lower your risk of fractures.  Be given hormone replacement therapy (HRT) to treat symptoms of menopause. Follow these instructions at home: Lifestyle  Do not use any products that contain nicotine or tobacco, such as cigarettes, e-cigarettes, and chewing tobacco. If you need help quitting, ask your health care provider.  Do not use street drugs.  Do not share needles.  Ask your health care provider for help if you need support or information about quitting drugs. Alcohol use  Do not drink alcohol if: ? Your health care provider tells you not to drink. ? You are pregnant, may be pregnant, or are planning to become pregnant.  If you drink alcohol: ? Limit how much you use to 0-1 drink a day. ? Limit intake if you are breastfeeding.  Be aware of how much alcohol is in your drink. In the [place] one drink equals one 12 oz bottle of beer (355 mL), one 5 oz glass of wine (148 mL), or one 1 oz glass of hard liquor (44 mL). General instructions  Schedule regular health, dental, and eye exams.  Stay current with your vaccines.  Tell your health care provider if: ? You often feel depressed. ? You have ever been abused or do not feel safe at home. Summary  Adopting a healthy lifestyle and getting preventive care are important in promoting health and wellness.  Follow your health care provider's instructions about healthy  diet, exercising, and getting tested or screened for diseases.  Follow your health care provider's instructions on monitoring your cholesterol and blood pressure. This information is not intended to replace advice given to you by your health care provider. Make sure you discuss any questions you have with your health care provider. Document Revised: 04/17/2018 Document Reviewed: 04/17/2018 Elsevier Patient Education  2020 [place] Breast self-awareness is knowing how your breasts look and feel. Doing breast self-awareness is important. It allows you to catch a breast problem early while it is still small and can be treated. All women should do breast self-awareness, including women who have had breast implants. Tell your doctor if you notice a change in your breasts. What you need:  A mirror.  A well-lit room. How to do a breast self-exam A breast self-exam is one way to learn what is normal for your breasts and to check for changes. To do a breast self-exam: Look for changes  1. Take off all  the clothes above your waist. 2. Stand in front of a mirror in a room with good lighting. 3. Put your hands on your hips. 4. Push your hands down. 5. Look at your breasts and nipples in the mirror to see if one breast or nipple looks different from the other. Check to see if: ? The shape of one breast is different. ? The size of one breast is different. ? There are wrinkles, dips, and bumps in one breast and not the other. 6. Look at each breast for changes in the skin, such as: ? Redness. ? Scaly areas. 7. Look for changes in your nipples, such as: ? Liquid around the nipples. ? Bleeding. ? Dimpling. ? Redness. ? A change in where the nipples are. Feel for changes  1. Lie on your back on the floor. 2. Feel each breast. To do this, follow these steps: ? Pick a breast to feel. ? Put the arm closest to that breast above your head. ? Use your other arm to feel  the nipple area of your breast. Feel the area with the pads of your three middle fingers by making small circles with your fingers. For the first circle, press lightly. For the second circle, press harder. For the third circle, press even harder. ? Keep making circles with your fingers at the different pressures as you move down your breast. Stop when you feel your ribs. ? Move your fingers a little toward the center of your body. ? Start making circles with your fingers again, this time going up until you reach your collarbone. ? Keep making up-and-down circles until you reach your armpit. Remember to keep using the three pressures. ? Feel the other breast in the same way. 3. Sit or stand in the tub or shower. 4. With soapy water on your skin, feel each breast the same way you did in step 2 when you were lying on the floor. Write down what you find Writing down what you find can help you remember what to tell your doctor. Write down:  What is normal for each breast.  Any changes you find in each breast, including: ? The kind of changes you find. ? Whether you have pain. ? Size and location of any lumps.  When you last had your menstrual period. General tips  Check your breasts every month.  If you are breastfeeding, the best time to check your breasts is after you feed your baby or after you use a breast pump.  If you get menstrual periods, the best time to check your breasts is 5-7 days after your menstrual period is over.  With time, you will become comfortable with the self-exam, and you will begin to know if there are changes in your breasts. Contact a doctor if you:  See a change in the shape or size of your breasts or nipples.  See a change in the skin of your breast or nipples, such as red or scaly skin.  Have fluid coming from your nipples that is not normal.  Find a lump or thick area that was not there before.  Have pain in your breasts.  Have any concerns about  your breast health. Summary  Breast self-awareness includes looking for changes in your breasts, as well as feeling for changes within your breasts.  Breast self-awareness should be done in front of a mirror in a well-lit room.  You should check your breasts every month. If you get menstrual periods, the best  time to check your breasts is 5-7 days after your menstrual period is over.  Let your doctor know of any changes you see in your breasts, including changes in size, changes on the skin, pain or tenderness, or fluid from your nipples that is not normal. This information is not intended to replace advice given to you by your health care provider. Make sure you discuss any questions you have with your health care provider. Document Revised: 12/11/2017 Document Reviewed: 12/11/2017 Elsevier Patient Education  [place].

## 2020-02-05 NOTE — Progress Notes (Signed)
Complete physical exam   Patient: Tonya Guzman   DOB: [birth year]   59 y.o. Female  MRN: [id number] Visit Date: 02/05/2020  Today's healthcare provider: Wilhemena Durie, MD   Chief Complaint  Patient presents with  . Annual Exam   Subjective     HPI   Tonya Guzman is a 59 y.o. female who presents today for a complete physical exam.  She reports consuming a general diet. Patient reports that she exercises weekly. She generally feels well. She reports sleeping well. She does not have additional problems to discuss today.    She is not exercising regularly but works full-time.  She has lost 20 pounds this year with [employer]. She saw [facility] last week for her GYN exam and she is followed at [facility] for her breast follow-up.  She is on Femara Last Mammogram - 03/05/2019- patient has scheduled mammogram in Oct with [facility] Last Pap- 01/29/2020- Negative Past Medical History:  Diagnosis Date  . 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 Family history of breast cancer   . Fatigue   . Insomnia   . Night sweats    Past Surgical History:  Procedure Laterality Date  . CESAREAN SECTION    . COLONOSCOPY  2010   repeat due after 10 yrs  . FOOT SURGERY Right   . KIDNEY SURGERY     Social History   Socioeconomic History  . Marital status: Married    Spouse name: Not on file  . Number of children: Not on file  . Years of education: Not on file  . Highest education level: Not on file  Occupational History  . Not on file  Tobacco Use  . Smoking status: Former Smoker    Packs/day: 0.25    Years: 1.00    Pack years: 0.25    Types: Cigarettes    Quit date: 05/08/2012    Years since quitting: 7.7  . Smokeless tobacco: Never Used  Vaping Use  . Vaping Use: Never used  Substance and Sexual Activity  . Alcohol use: Yes    Comment: 3 drinks a week  . Drug use: No  . Sexual activity: Not Currently    Birth control/protection: Post-menopausal  Other Topics Concern    . Not on file  Social History Narrative  . Not on file   Social Determinants of Health   Financial Resource Strain:   . Difficulty of Paying Living Expenses: Not on file  Food Insecurity:   . Worried About Charity fundraiser in the Last Year: Not on file  . Ran Out of Food in the Last Year: Not on file  Transportation Needs:   . Lack of Transportation (Medical): Not on file  . Lack of Transportation (Non-Medical): Not on file  Physical Activity:   . Days of Exercise per Week: Not on file  . Minutes of Exercise per Session: Not on file  Stress:   . Feeling of Stress : Not on file  Social Connections:   . Frequency of Communication with Friends and Family: Not on file  . Frequency of Social Gatherings with Friends and Family: Not on file  . Attends Religious Services: Not on file  . Active Member of Clubs or Organizations: Not on file  . Attends Archivist Meetings: Not on file  . Marital Status: Not on file  Intimate Partner Violence:   . Fear of Current or Ex-Partner: Not on file  .  Emotionally Abused: Not on file  . Physically Abused: Not on file  . Sexually Abused: Not on file   Family Status  Relation Name Status  . Mother  Alive  . Father  Deceased at age 32  . Sister  Alive  . Brother  Alive  . Sister  Alive  . Brother  Deceased  . Mat Aunt  Deceased  . Mat Uncle  Deceased  . Annamarie Major  Deceased  . MGM  Deceased  . MGF  Deceased  . PGM  Deceased  . PGF  Deceased  . Mat Uncle  Deceased  . Mat Uncle x3 Deceased  . Son  Alive  . Daughter  Alive  . Neg Hx  (Not Specified)   Family History  Problem Relation Age of Onset  . Pneumonia Mother   . Hypertension Mother   . Anemia Mother   . Hyperlipidemia Father   . Hypertension Father   . Heart disease Father   . Heart attack Father   . Cancer Father        vocal  . Hypertension Sister   . Breast cancer Sister        right breast  . Hypertension Sister   . Hepatitis C Brother   . Breast  cancer Maternal Aunt 70  . Cancer Maternal Uncle        unk type  . Cancer Maternal Uncle        unk type  . Ovarian cancer Neg Hx   . Colon cancer Neg Hx   . Diabetes Neg Hx    No Known Allergies  Patient Care Team: Jerrol Banana., MD as PCP - General (Family Medicine)   Medications: Outpatient Medications Prior to Visit  Medication Sig  . ALPRAZolam (XANAX) 0.5 MG tablet TAKE 1 TABLET BY MOUTH NIGHTLY AS NEEDED  . aspirin EC 81 MG tablet Take 81 mg by mouth daily.  Marland Kitchen letrozole (FEMARA) 2.5 MG tablet Take 2.5 mg by mouth daily.  . Magnesium Chloride (MAGNESIUM DR PO) Take by mouth.  Marland Kitchen NASONEX 50 MCG/ACT nasal spray Reported on 07/21/2015   No facility-administered medications prior to visit.    Review of Systems  All other systems reviewed and are negative.      Objective    BP 99/76   Pulse 82   Temp 98.6 F (37 C) (Oral)   Resp 16   Ht 5' 7"  (1.702 m)   Wt 156 lb 6.4 oz (70.9 kg)   BMI 24.50 kg/m  BP Readings from Last 3 Encounters:  02/05/20 99/76  01/29/20 112/70  09/15/19 109/72   Wt Readings from Last 3 Encounters:  02/05/20 156 lb 6.4 oz (70.9 kg)  01/29/20 157 lb 9.6 oz (71.5 kg)  09/15/19 156 lb 3.2 oz (70.9 kg)      Physical Exam Vitals reviewed.  Constitutional:      Appearance: She is well-developed.  HENT:     Head: Normocephalic and atraumatic.     Right Ear: External ear normal.     Left Ear: External ear normal.     Nose: Nose normal.  Eyes:     General: No scleral icterus.    Conjunctiva/sclera: Conjunctivae normal.  Neck:     Thyroid: No thyromegaly.  Cardiovascular:     Rate and Rhythm: Normal rate and regular rhythm.     Heart sounds: Normal heart sounds.  Pulmonary:     Effort: Pulmonary effort is normal.  Breath sounds: Normal breath sounds.  Abdominal:     Palpations: Abdomen is soft.  Skin:    General: Skin is warm and dry.  Neurological:     General: No focal deficit present.     Mental Status: She is  alert and oriented to person, place, and time.  Psychiatric:        Mood and Affect: Mood normal.        Behavior: Behavior normal.        Thought Content: Thought content normal.        Judgment: Judgment normal.       Last depression screening scores PHQ 2/9 Scores 03/18/2019 01/31/2018 01/30/2017  PHQ - 2 Score 0 0 2  PHQ- 9 Score 1 0 7   Last fall risk screening Fall Risk  01/31/2018  Falls in the past year? No   Last Audit-C alcohol use screening Alcohol Use Disorder Test (AUDIT) 03/18/2019  1. How often do you have a drink containing alcohol? 3  2. How many drinks containing alcohol do you have on a typical day when you are drinking? 0  3. How often do you have six or more drinks on one occasion? 0  AUDIT-C Score 3   A score of 3 or more in women, and 4 or more in men indicates increased risk for alcohol abuse, EXCEPT if all of the points are from question 1   No results found for any visits on 02/05/20.  Assessment & Plan    Routine Health Maintenance and Physical Exam  Exercise Activities and Dietary recommendations Goals   None     Immunization History  Administered Date(s) Administered  . Td 01/31/2018  . Tdap 03/13/2007    Health Maintenance  Topic Date Due  . HIV Screening  Never done  . COVID-19 Vaccine (1) 02/14/2020 (Originally 04/19/1973)  . INFLUENZA VACCINE  08/05/2020 (Originally 12/07/2019)  . COLONOSCOPY  01/04/2021  . MAMMOGRAM  03/04/2021  . PAP SMEAR-Modifier  01/29/2023  . TETANUS/TDAP  02/01/2028  . Hepatitis C Screening  Completed    Discussed health benefits of physical activity, and encouraged her to engage in regular exercise appropriate for her age and condition.  1. Annual physical exam Well woman exam per [facility] - CBC with Differential/Platelet - Comprehensive metabolic panel - TSH - Lipid panel  2. Screening for lipid disorders  - Lipid panel  3. Screening for blood or protein in urine  - POCT urinalysis  dipstick  4 breast cancer Followed at [facility].  On Femara No follow-ups on file.     I, Wilhemena Durie, MD, have reviewed all documentation for this visit. The documentation on 02/05/20 for the exam, diagnosis, procedures, and orders are all accurate and complete.    Richard Cranford Mon, MD  [facility] [phone] (phone) [phone] (fax)  [place]

## 2020-02-17 LAB — COMPREHENSIVE METABOLIC PANEL
ALT: 21 IU/L (ref 0–32)
AST: 19 IU/L (ref 0–40)
Albumin/Globulin Ratio: 2.1 (ref 1.2–2.2)
Albumin: 4.5 g/dL (ref 3.8–4.9)
Alkaline Phosphatase: 46 IU/L (ref 44–121)
BUN/Creatinine Ratio: 23 (ref 12–28)
BUN: 18 mg/dL (ref 8–27)
Bilirubin Total: 0.5 mg/dL (ref 0.0–1.2)
CO2: 23 mmol/L (ref 20–29)
Calcium: 9.6 mg/dL (ref 8.7–10.3)
Chloride: 106 mmol/L (ref 96–106)
Creatinine, Ser: 0.8 mg/dL (ref 0.57–1.00)
GFR calc Af Amer: 93 mL/min/{1.73_m2} (ref >59)
GFR calc non Af Amer: 80 mL/min/{1.73_m2} (ref >59)
Globulin, Total: 2.1 g/dL (ref 1.5–4.5)
Glucose: 106 mg/dL — ABNORMAL HIGH (ref 65–99)
Potassium: 4.3 mmol/L (ref 3.5–5.2)
Sodium: 143 mmol/L (ref 134–144)
Total Protein: 6.6 g/dL (ref 6.0–8.5)

## 2020-02-17 LAB — CBC WITH DIFFERENTIAL/PLATELET
Basophils Absolute: 0 10*3/uL (ref 0.0–0.2)
Basos: 1 %
EOS (ABSOLUTE): 0.2 10*3/uL (ref 0.0–0.4)
Eos: 4 %
Hematocrit: 38.4 % (ref 34.0–46.6)
Hemoglobin: 12.5 g/dL (ref 11.1–15.9)
Immature Grans (Abs): 0 10*3/uL (ref 0.0–0.1)
Immature Granulocytes: 0 %
Lymphocytes Absolute: 1.6 10*3/uL (ref 0.7–3.1)
Lymphs: 38 %
MCH: 31.3 pg (ref 26.6–33.0)
MCHC: 32.6 g/dL (ref 31.5–35.7)
MCV: 96 fL (ref 79–97)
Monocytes Absolute: 0.4 10*3/uL (ref 0.1–0.9)
Monocytes: 10 %
Neutrophils Absolute: 1.9 10*3/uL (ref 1.4–7.0)
Neutrophils: 47 %
Platelets: 218 10*3/uL (ref 150–450)
RBC: 3.99 x10E6/uL (ref 3.77–5.28)
RDW: 12.7 % (ref 11.7–15.4)
WBC: 4.1 10*3/uL (ref 3.4–10.8)

## 2020-02-17 LAB — LIPID PANEL
Chol/HDL Ratio: 2.8 ratio (ref 0.0–4.4)
Cholesterol, Total: 244 mg/dL — ABNORMAL HIGH (ref 100–199)
HDL: 87 mg/dL (ref >39)
LDL Chol Calc (NIH): 145 mg/dL — ABNORMAL HIGH (ref 0–99)
Triglycerides: 74 mg/dL (ref 0–149)
VLDL Cholesterol Cal: 12 mg/dL (ref 5–40)

## 2020-02-17 LAB — TSH: TSH: 2.36 u[IU]/mL (ref 0.450–4.500)

## 2020-03-11 ENCOUNTER — Other Ambulatory Visit: Payer: Self-pay | Admitting: Family Medicine

## 2020-03-11 DIAGNOSIS — F419 Anxiety disorder, unspecified: Secondary | ICD-10-CM

## 2020-03-11 NOTE — Telephone Encounter (Signed)
Requested medication (s) are due for refill today- no  Requested medication (s) are on the active medication list -yes  Future visit scheduled -yes  Last refill: 02/11/20  Notes to clinic: Request RF of non delegated Rx- pharmacy requesting future fill rx  Requested Prescriptions  Pending Prescriptions Disp Refills   ALPRAZolam (XANAX) 0.5 MG tablet [Pharmacy Med Name: ALPRAZOLAM 0.5 MG TAB] 30 tablet     Sig: TAKE 1 TABLET BY MOUTH NIGHTLY AS NEEDED      Not Delegated - Psychiatry:  Anxiolytics/Hypnotics Failed - 03/11/2020 10:22 AM      Failed - This refill cannot be delegated      Failed - Urine Drug Screen completed in last 360 days      Passed - Valid encounter within last 6 months    Recent Outpatient Visits           1 month ago Annual physical exam   [facility] Jerrol Banana., MD   5 months ago [place] Jerrol Banana., MD   11 months ago Annual physical exam   [facility] Jerrol Banana., MD   1 year ago    [facility] Rosanna Randy, Retia Passe., MD   2 years ago Annual physical exam   [facility] Jerrol Banana., MD       Future Appointments             In 11 months Jerrol Banana., MD [facility]                Requested Prescriptions  Pending Prescriptions Disp Refills   ALPRAZolam Duanne Moron) 0.5 MG tablet [Pharmacy Med Name: ALPRAZOLAM 0.5 MG TAB] 30 tablet     Sig: TAKE 1 TABLET BY MOUTH NIGHTLY AS NEEDED      Not Delegated - Psychiatry:  Anxiolytics/Hypnotics Failed - 03/11/2020 10:22 AM      Failed - This refill cannot be delegated      Failed - Urine Drug Screen completed in last 360 days      Passed - Valid encounter within last 6 months    Recent Outpatient Visits           1 month ago Annual physical exam   [facility] Jerrol Banana., MD   5 months ago [place] Jerrol Banana., MD   11 months ago Annual physical exam   [facility] Jerrol Banana., MD   1 year ago    [facility] Rosanna Randy, Retia Passe., MD   2 years ago Annual physical exam   [facility] Jerrol Banana., MD       Future Appointments             In 11 months Jerrol Banana., MD [facility] [place]

## 2020-05-31 ENCOUNTER — Telehealth: Payer: Self-pay

## 2020-05-31 NOTE — Telephone Encounter (Signed)
Copied from [place] [id number]. Topic: General - Other >> May 31, 2020  4:06 PM Leward Quan A wrote: Reason for CRM: Patient called in to inquire of Dr Rosanna Randy if he can call in something for her since she have had a positive Covid test since Friday 05/28/20. Would like something for the sore throat and chest congestion, has low grade fever on and off. Ph# [phone]

## 2020-05-31 NOTE — Telephone Encounter (Signed)
Please advise 

## 2020-06-04 ENCOUNTER — Ambulatory Visit: Admission: RE | Admit: 2020-06-04 | Payer: BC Managed Care – PPO | Source: Ambulatory Visit | Admitting: *Deleted

## 2020-06-04 ENCOUNTER — Other Ambulatory Visit: Payer: Self-pay

## 2020-06-04 ENCOUNTER — Ambulatory Visit
Admission: RE | Admit: 2020-06-04 | Discharge: 2020-06-04 | Disposition: A | Discharge status: Home / Self Care | Payer: BC Managed Care – PPO | Attending: Adult Health | Admitting: Adult Health

## 2020-06-04 ENCOUNTER — Encounter: Payer: Self-pay | Admitting: Adult Health

## 2020-06-04 ENCOUNTER — Telehealth (INDEPENDENT_AMBULATORY_CARE_PROVIDER_SITE_OTHER): Payer: BC Managed Care – PPO | Admitting: Adult Health

## 2020-06-04 ENCOUNTER — Ambulatory Visit
Admission: RE | Admit: 2020-06-04 | Discharge: 2020-06-04 | Disposition: A | Discharge status: Home / Self Care | Payer: BC Managed Care – PPO | Source: Ambulatory Visit | Attending: Adult Health | Admitting: Adult Health

## 2020-06-04 ENCOUNTER — Ambulatory Visit: Payer: Self-pay

## 2020-06-04 DIAGNOSIS — R5383 Other fatigue: Secondary | ICD-10-CM | POA: Insufficient documentation

## 2020-06-04 DIAGNOSIS — R059 Cough, unspecified: Secondary | ICD-10-CM | POA: Insufficient documentation

## 2020-06-04 DIAGNOSIS — Z170 Estrogen receptor positive status [ER+]: Secondary | ICD-10-CM

## 2020-06-04 DIAGNOSIS — R0781 Pleurodynia: Secondary | ICD-10-CM | POA: Diagnosis not present

## 2020-06-04 DIAGNOSIS — R071 Chest pain on breathing: Secondary | ICD-10-CM | POA: Insufficient documentation

## 2020-06-04 DIAGNOSIS — M545 Low back pain, unspecified: Secondary | ICD-10-CM

## 2020-06-04 DIAGNOSIS — U071 COVID-19: Secondary | ICD-10-CM | POA: Insufficient documentation

## 2020-06-04 DIAGNOSIS — C50411 Malignant neoplasm of upper-outer quadrant of right female breast: Secondary | ICD-10-CM

## 2020-06-04 MED ORDER — AZITHROMYCIN 250 MG PO TABS: ORAL_TABLET | ORAL | 0 refills | Status: DC

## 2020-06-04 MED ORDER — PREDNISONE 10 MG (21) PO TBPK: ORAL_TABLET | ORAL | 0 refills | Status: DC

## 2020-06-04 NOTE — Progress Notes (Addendum)
MyChart Video Visit    Virtual Visit via Video Note   This visit type was conducted due to national recommendations for restrictions regarding the COVID-19 Pandemic (e.g. social distancing) in an effort to limit this patient's exposure and mitigate transmission in our community. This patient is at least at moderate risk for complications without adequate follow up. This format is felt to be most appropriate for this patient at this time. Physical exam was limited by quality of the video and audio technology used for the visit.    Parties involved in visit as below:    Patient location: at home  Provider location: Provider: Provider's office at  [facility], [place].     I discussed the limitations of evaluation and management by telemedicine and the availability of in person appointments. The patient expressed understanding and agreed to proceed.  Patient: Tonya Guzman   DOB: Jul 10, 1960   60 y.o. Female  MRN: [id number] Visit Date: 06/04/2020  Today's healthcare provider: Marcille Buffy, FNP   Chief Complaint  Patient presents with  . Covid Positive   Subjective    Chest Pain  This is a new problem. The current episode started 1 to 4 weeks ago (2 weeks ). The onset quality is sudden. The problem has been unchanged. The pain is present in the substernal region. The pain is mild. Quality: unable to describe " just hurts and feels like a catch when I deep breath at times" deneis any  injury or trauma.  The pain does not radiate. Associated symptoms include a cough, dizziness, malaise/fatigue, shortness of breath (with taking deep breath only. ) and weakness. Pertinent negatives include no abdominal pain, back pain, claudication, diaphoresis, exertional chest pressure, fever, headaches, hemoptysis, irregular heartbeat, leg pain, lower extremity edema, nausea, near-syncope, numbness, orthopnea, palpitations, PND, sputum production, syncope or vomiting.  Associated symptoms comments: Denies rash. . Cough associated with: positive covid  The cough is productive. There is no color change associated with the cough. Cough relieved by: taking mucinex and over the counter cough medication was covid positive 05/28/20. Nothing worsens the cough. The pain is aggravated by deep breathing and coughing. Treatments tried: mucinex and over the counter cough medication and flonase.  The treatment provided no relief. Risk factors include post-menopausal and being elderly (on femera ).  Her past medical history is significant for cancer.  Pertinent negatives for past medical history include no recent injury. Past medical history comments: former smoker, on Femera    fever and chills.  Tested positive on Friday for Covid. 05/28/20. She is having congestion in nose and has been blood tinged, yellow mixed with blood. She has coughed up some chest congestion streaked with blood as well.  She has pain with very deep breath. She has not been coughing a lot at night, she has been coughing during the day. She does of note have breast cancer and is on Femera.  Onset 05/25/20.  She reports the pain only when taking a very deep breath. She does not endorse chest pressure or chest pain at rest or with exertion. Denies any chest tightness.  Denies any other pain. She is taking flonase.   Fatigue.   She has lower back pain that is reported as mild. No radiculopathy.  - denies any urinary symptoms.   Denies any arm, neck or jaw pain.  denies any cardiac history.   Past Medical History:  Diagnosis Date  . Breast cancer ([place]) 03/2019   ER/PR+,  her2 neu neg  . Family history of breast cancer   . Fatigue   . Insomnia   . Night sweats    Past Surgical History:  Procedure Laterality Date  . CESAREAN SECTION    . COLONOSCOPY  2010   repeat due after 10 yrs  . FOOT SURGERY Right   . KIDNEY SURGERY     Social History   Tobacco Use  . Smoking status: Former Smoker     Packs/day: 0.25    Years: 1.00    Pack years: 0.25    Types: Cigarettes    Quit date: 05/08/2012    Years since quitting: 8.0  . Smokeless tobacco: Never Used  Vaping Use  . Vaping Use: Never used  Substance Use Topics  . Alcohol use: Yes    Comment: 3 drinks a week  . Drug use: No   Social History   Socioeconomic History  . Marital status: Married    Spouse name: Not on file  . Number of children: Not on file  . Years of education: Not on file  . Highest education level: Not on file  Occupational History  . Not on file  Tobacco Use  . Smoking status: Former Smoker    Packs/day: 0.25    Years: 1.00    Pack years: 0.25    Types: Cigarettes    Quit date: 05/08/2012    Years since quitting: 8.0  . Smokeless tobacco: Never Used  Vaping Use  . Vaping Use: Never used  Substance and Sexual Activity  . Alcohol use: Yes    Comment: 3 drinks a week  . Drug use: No  . Sexual activity: Not Currently    Birth control/protection: Post-menopausal  Other Topics Concern  . Not on file  Social History Narrative  . Not on file   Social Determinants of Health   Financial Resource Strain: Not on file  Food Insecurity: Not on file  Transportation Needs: Not on file  Physical Activity: Not on file  Stress: Not on file  Social Connections: Not on file  Intimate Partner Violence: Not on file   Family Status  Relation Name Status  . Mother  Alive  . Father  Deceased at age 32  . Sister  Alive  . Brother  Alive  . Sister  Alive  . Brother  Deceased  . Mat Aunt  Deceased  . Mat Uncle  Deceased  . Annamarie Major  Deceased  . MGM  Deceased  . MGF  Deceased  . PGM  Deceased  . PGF  Deceased  . Mat Uncle  Deceased  . Mat Uncle x3 Deceased  . Son  Alive  . Daughter  Alive  . Neg Hx  (Not Specified)   Family History  Problem Relation Age of Onset  . Pneumonia Mother   . Hypertension Mother   . Anemia Mother   . Hyperlipidemia Father   . Hypertension Father   . Heart disease  Father   . Heart attack Father   . Cancer Father        vocal  . Hypertension Sister   . Breast cancer Sister        right breast  . Hypertension Sister   . Hepatitis C Brother   . Breast cancer Maternal Aunt 70  . Cancer Maternal Uncle        unk type  . Cancer Maternal Uncle        unk type  . Ovarian cancer  Neg Hx   . Colon cancer Neg Hx   . Diabetes Neg Hx    No Known Allergies    Medications: Outpatient Medications Prior to Visit  Medication Sig  . ALPRAZolam (XANAX) 0.5 MG tablet TAKE 1 TABLET BY MOUTH NIGHTLY AS NEEDED  . aspirin EC 81 MG tablet Take 81 mg by mouth daily.  . cyanocobalamin 1000 MCG tablet Take by mouth.  . letrozole (FEMARA) 2.5 MG tablet Take 2.5 mg by mouth daily.  . Magnesium Chloride (MAGNESIUM DR PO) Take by mouth.  Marland Kitchen NASONEX 50 MCG/ACT nasal spray Reported on 07/21/2015   No facility-administered medications prior to visit.    Review of Systems  Constitutional: Positive for malaise/fatigue. Negative for diaphoresis and fever.  Respiratory: Positive for cough and shortness of breath (with taking deep breath only. ). Negative for hemoptysis and sputum production.   Cardiovascular: Positive for chest pain. Negative for palpitations, orthopnea, claudication, syncope, PND and near-syncope.  Gastrointestinal: Negative for abdominal pain, nausea and vomiting.  Musculoskeletal: Negative for back pain.  Neurological: Positive for dizziness and weakness. Negative for numbness and headaches.    Last CBC Lab Results  Component Value Date   WBC 4.1 02/16/2020   HGB 12.5 02/16/2020   HCT 38.4 02/16/2020   MCV 96 02/16/2020   MCH 31.3 02/16/2020   RDW 12.7 02/16/2020   PLT 218 52/84/1324   Last metabolic panel Lab Results  Component Value Date   GLUCOSE 106 (H) 02/16/2020   NA 143 02/16/2020   K 4.3 02/16/2020   CL 106 02/16/2020   CO2 23 02/16/2020   BUN 18 02/16/2020   CREATININE 0.80 02/16/2020   GFRNONAA 80 02/16/2020   GFRAA 93  02/16/2020   CALCIUM 9.6 02/16/2020   PROT 6.6 02/16/2020   ALBUMIN 4.5 02/16/2020   LABGLOB 2.1 02/16/2020   AGRATIO 2.1 02/16/2020   BILITOT 0.5 02/16/2020   ALKPHOS 46 02/16/2020   AST 19 02/16/2020   ALT 21 02/16/2020   Last lipids Lab Results  Component Value Date   CHOL 244 (H) 02/16/2020   HDL 87 02/16/2020   LDLCALC 145 (H) 02/16/2020   TRIG 74 02/16/2020   CHOLHDL 2.8 02/16/2020   Last hemoglobin A1c No results found for: HGBA1C Last thyroid functions Lab Results  Component Value Date   TSH 2.360 02/16/2020   Last vitamin D No results found for: 25OHVITD2, 25OHVITD3, VD25OH Last vitamin B12 and Folate No results found for: VITAMINB12, FOLATE    Objective    There were no vitals taken for this visit. BP Readings from Last 3 Encounters:  02/05/20 99/76  01/29/20 112/70  09/15/19 109/72   Wt Readings from Last 3 Encounters:  02/05/20 156 lb 6.4 oz (70.9 kg)  01/29/20 157 lb 9.6 oz (71.5 kg)  09/15/19 156 lb 3.2 oz (70.9 kg)      Physical Exam     Patient is alert and oriented and responsive to questions Engages in conversation with provider. Speaks in full sentences without any pauses without any shortness of breath or distress.     Assessment & Plan      Pleuritic chest pain - Plan: CT ANGIO CHEST PE W OR WO CONTRAST, CANCELED: CT ANGIO CHEST PE W OR WO CONTRAST  Fatigue, unspecified type - Plan: DG Chest 2 View, CBC with Differential/Platelet, Comprehensive metabolic panel, CT ANGIO CHEST PE W OR WO CONTRAST, CANCELED: CT ANGIO CHEST PE W OR WO CONTRAST  Cough - Plan: DG Chest 2 View,  CBC with Differential/Platelet, Comprehensive metabolic panel, CT ANGIO CHEST PE W OR WO CONTRAST, CANCELED: CT ANGIO CHEST PE W OR WO CONTRAST  Malignant neoplasm of upper-outer quadrant of right breast in female, estrogen receptor positive ([place]) - Plan: CT ANGIO CHEST PE W OR WO CONTRAST, CANCELED: CT ANGIO CHEST PE W OR WO CONTRAST  Acute midline low back pain  without sciatica - Plan: CT ANGIO CHEST PE W OR WO CONTRAST, CANCELED: CT ANGIO CHEST PE W OR WO CONTRAST  Chest pain on breathing - Plan: CT ANGIO CHEST PE W OR WO CONTRAST, CANCELED: CT ANGIO CHEST PE W OR WO CONTRAST  Chest x ray ordered was within normal limits, given patient is on Femera  Will go ahead and proceed with CT angio rule out pulmonary embolism since chest x ray is within normal limits.    Strict go to the ER precautions was given. Patient verbalized understanding of all instructions given and denies any further questions at this time.   Return in about 3 days (around 06/07/2020), or if symptoms worsen or fail to improve, for Go to Emergency room/ urgent care if worse.     I discussed the assessment and treatment plan with the patient. The patient was provided an opportunity to ask questions and all were answered. The patient agreed with the plan and demonstrated an understanding of the instructions.   The patient was advised to call back or seek an in-person evaluation if the symptoms worsen or if the condition fails to improve as anticipated.  I provided  30  minutes of non-face-to-face time during this encounter.  Red Flags discussed. The patient was given clear instructions to go to ER or return to medical center if any red flags develop, symptoms do not improve, worsen or new problems develop. They verbalized understanding.  The entirety of the information documented in the History of Present Illness, Review of Systems and Physical Exam were personally obtained by me. Portions of this information were initially documented by the CMA and reviewed by me for thoroughness and accuracy.   I discussed the limitations of evaluation and management by telemedicine and the availability of in person appointments. The patient expressed understanding and agreed to proceed.   Marcille Buffy, [place] [phone] (phone) [phone] (fax)  [place]

## 2020-06-04 NOTE — Telephone Encounter (Signed)
FYI

## 2020-06-04 NOTE — Progress Notes (Signed)
Chest x ray within normal limits.

## 2020-06-04 NOTE — Telephone Encounter (Signed)
Patient called stating that she took a home test Friday 05/28/20 and it was positive for COVID-19  She has returned to work and now is noticing that if she tries to take a deep breath she has pain in the center of her chest which stops her from taking the deep breath.  She has a productive cough and has noticed blood in her phlegm  She also has blood when she blows her nose.  She denies fever but states she has  Fatigue. Per protocol My chart Visit was scheduled for today.  She verbalized understanding of instruction.   Reason for Disposition . [1] MILD difficulty breathing (e.g., minimal/no SOB at rest, SOB with walking, pulse <100) AND [2] new-onset  Answer Assessment - Initial Assessment Questions 1. COVID-19 ONSET: "When did the symptoms of COVID-19 first start?"     Friday positive  2. DIAGNOSIS CONFIRMATION: "How were you diagnosed?" (e.g., COVID-19 oral or nasal viral test; COVID-19 antibody test; doctor visit)     Home test 3. MAIN SYMPTOM:  "What is your main concern or symptom right now?" (e.g., breathing difficulty, cough, fatigue. loss of smell)    Breathing hurt in middle chest 4. SYMPTOM ONSET: "When did the  Chest pain  start?"   wednesday 5. BETTER-SAME-WORSE: "Are you getting better, staying the same, or getting worse over the last 1 to 2 weeks?"     same 6. RECENT MEDICAL VISIT: "Have you been seen by a healthcare provider (doctor, NP, PA) for these persisting COVID-19 symptoms?" If Yes, ask: "When were you seen?" (e.g., date)    none 7. COUGH: "Do you have a cough?" If Yes, ask: "How bad is the cough?"       Yes bloody mucus when blowing nose 8. FEVER: "Do you have a fever?" If Yes, ask: "What is your temperature, how was it measured, and when did it start?"     no 9. BREATHING DIFFICULTY: "Are you having any trouble breathing?" If Yes, ask: "How bad is your breathing?" (e.g., mild, moderate, severe)    - MILD: No SOB at rest, mild SOB with walking, speaks normally in  sentences, can lie down, no retractions, pulse < 100.    - MODERATE: SOB at rest, SOB with minimal exertion and prefers to sit, cannot lie down flat, speaks in phrases, mild retractions, audible wheezing, pulse 100-120.    - SEVERE: Very SOB at rest, speaks in single words, struggling to breathe, sitting hunched forward, retractions, pulse > 120       Not SOB but pain in center chest 10. HIGH RISK DISEASE: "Do you have any chronic medical problems?" (e.g., asthma, heart or lung disease, weak immune system, obesity, etc.)      no 11. VACCINE: "Have you gotten the COVID-19 vaccine?" If Yes ask: "Which one, how many shots, when did you get it?"       no 12. PREGNANCY: "Is there any chance you are pregnant?" "When was your last menstrual period?"      N/A 13. OTHER SYMPTOMS: "Do you have any other symptoms?"  (e.g., fatigue, headache, muscle pain, weakness)      Fatigue  Protocols used: CORONAVIRUS (COVID-19) PERSISTING SYMPTOMS FOLLOW-UP CALL-A-AH

## 2020-06-04 NOTE — Patient Instructions (Signed)
Pleurisy  Pleurisy is irritation and swelling of the linings of your lungs. This can cause pain in your chest, back, or shoulder. It can also cause trouble breathing. What are the causes?  A lung infection.  A blood clot that travels to the lungs.  Injury to the chest.  Lung cancer or a lung tumor.  Heart or chest surgery.  Lung damage from inhaling asbestos.  Certain medicines or treatment for cancer.  Diseases that can cause irritation and swelling of the lungs. Sometimes, the cause is not known. What are the signs or symptoms? The main symptom is chest pain, usually on one side. The pain starts suddenly and can be sharp, stabbing, or dull. It may get worse when you cough or breathe deep. Other symptoms include:  Trouble breathing.  Noisy breathing.  Cough.  Chills.  Fever.  Coughing up blood. Symptoms can be worse when you are lying down or lying to one side. How is this treated? Treatment depends on the cause. It may include:  Medicines to help with swelling.  Antibiotic medicines, if your condition was caused by an infection by bacteria.  Pain medicine.  Cough medicine.  Blood thinners, if your condition was caused by a blood clot.  Taking the fluid or air out of your lungs with a tube. Follow these instructions at home: Medicines  Take over-the-counter and prescription medicines only as told by your doctor.  Take your antibiotic medicine as told by your doctor. Do not stop taking the antibiotic even if you start to feel better.  If you were prescribed medicines to remove extra fluid from your lungs (diuretics), take them as told by your doctor.  If you were prescribed blood thinners, take them exactly as prescribed. This is important. Activity  Rest and return to your normal activities as told by your doctor. Ask your doctor what activities are safe for you.  Ask your health care provider if the medicine prescribed to you requires you to avoid  driving or using machinery. General instructions  Watch for any changes in how you feel.  Take deep breaths often, even if it hurts. This can help prevent lung problems.  You may be given a small machine to help exercise your lungs and breathing, to prevent lung complications.  Do not use any products that contain nicotine or tobacco, such as cigarettes, e-cigarettes, and chewing tobacco. If you need help quitting, ask your doctor.  Keep all follow-up visits as told by your doctor. This is important.   Contact a doctor if:  You have pain that: ? Gets worse. ? Happens more often. ? Does not get better with medicine.  You have a fever or chills.  Your cough gets worse.  You have trouble breathing.  You cough up mucus or thick spit (phlegm) that looks like pus. Get help right away if you:  Cough up blood.  Have symptoms that get worse. This includes: ? Trouble breathing. ? Feeling like it is hard to breathe. ? Noisy breathing.  Have pain that spreads into your neck, arms, or jaw.  Feel faint or dizzy. These symptoms may be an emergency. Do not wait to see if the symptoms will go away. Get medical help right away. Call your local emergency services (911 in the [place] Do not drive yourself to the hospital. Summary  Pleurisy is irritation and swelling of the linings of your lungs.  Pleurisy can cause pain and trouble breathing.  Do not use any products that contain   nicotine or tobacco, such as cigarettes, e-cigarettes, and chewing tobacco. If you need help quitting, ask your doctor.  Keep all follow-up visits as told by your doctor. This is important. This information is not intended to replace advice given to you by your health care provider. Make sure you discuss any questions you have with your health care provider. Document Revised: 05/29/2019 Document Reviewed: 05/29/2019 Elsevier Patient Education  2021 Elsevier Inc.  

## 2020-06-04 NOTE — Progress Notes (Signed)
Her chest x ray is within normal limits.  Will you please have her check her vital signs at home pulse, blood pressure, temp and oxygen if able.

## 2020-06-09 NOTE — Progress Notes (Signed)
[id number] 

## 2020-08-03 NOTE — Progress Notes (Signed)
This encounter was created in error - please disregard.

## 2020-09-04 ENCOUNTER — Other Ambulatory Visit: Payer: Self-pay | Admitting: Family Medicine

## 2020-09-04 DIAGNOSIS — F419 Anxiety disorder, unspecified: Secondary | ICD-10-CM

## 2020-09-04 NOTE — Telephone Encounter (Signed)
Requested medication (s) are due for refill today: yes  Requested medication (s) are on the active medication list: yes  Last refill:  03/12/20  Future visit scheduled: yes  Notes to clinic:  med not delegated to NT to RF   Requested Prescriptions  Pending Prescriptions Disp Refills   ALPRAZolam (XANAX) 0.5 MG tablet [Pharmacy Med Name: ALPRAZOLAM 0.5 MG TAB] 30 tablet     Sig: TAKE 1 TABLET BY MOUTH NIGHTLY AS NEEDED      Not Delegated - Psychiatry:  Anxiolytics/Hypnotics Failed - 09/04/2020  8:39 AM      Failed - This refill cannot be delegated      Failed - Urine Drug Screen completed in last 360 days      Passed - Valid encounter within last 6 months    Recent Outpatient Visits           3 months ago Pleuritic chest pain   [place] Flinchum, Kelby Aline, FNP   7 months ago Annual physical exam   [facility] Jerrol Banana., MD   11 months ago [place] Jerrol Banana., MD   1 year ago Annual physical exam   [facility] Jerrol Banana., MD   2 years ago Annual physical exam   [facility] Jerrol Banana., MD       Future Appointments             In 5 months Jerrol Banana., MD [facility], [place]

## 2020-10-06 ENCOUNTER — Other Ambulatory Visit: Payer: Self-pay | Admitting: Family Medicine

## 2020-10-06 DIAGNOSIS — F419 Anxiety disorder, unspecified: Secondary | ICD-10-CM

## 2020-10-06 NOTE — Telephone Encounter (Signed)
Requested medication (s) are due for refill today: yes  Requested medication (s) are on the active medication list:  yes  Last refill:  09/08/20  Future visit scheduled: yes  Notes to clinic:  not delegated    Requested Prescriptions  Pending Prescriptions Disp Refills   ALPRAZolam (XANAX) 0.5 MG tablet [Pharmacy Med Name: ALPRAZOLAM 0.5 MG TAB] 30 tablet     Sig: TAKE 1 TABLET BY MOUTH AT BEDTIME AS NEEDED      Not Delegated - Psychiatry:  Anxiolytics/Hypnotics Failed - 10/06/2020  9:54 AM      Failed - This refill cannot be delegated      Failed - Urine Drug Screen completed in last 360 days      Passed - Valid encounter within last 6 months    Recent Outpatient Visits           4 months ago Pleuritic chest pain   [place] Flinchum, Kelby Aline, FNP   8 months ago Annual physical exam   [facility] Jerrol Banana., MD   1 year ago [place] Jerrol Banana., MD   1 year ago Annual physical exam   [facility] Jerrol Banana., MD   2 years ago Annual physical exam   [facility] Jerrol Banana., MD       Future Appointments             In 4 months Jerrol Banana., MD [facility], [place]

## 2020-11-06 ENCOUNTER — Other Ambulatory Visit: Payer: Self-pay | Admitting: Family Medicine

## 2020-11-06 DIAGNOSIS — F419 Anxiety disorder, unspecified: Secondary | ICD-10-CM

## 2020-11-06 NOTE — Telephone Encounter (Signed)
Requested medication (s) are due for refill today: yes  Requested medication (s) are on the active medication list: yes  Last refill: 09/08/20  Future visit scheduled: yes  Notes to clinic:  med not delegated to NT to RF   Requested Prescriptions  Pending Prescriptions Disp Refills   ALPRAZolam (XANAX) 0.5 MG tablet [Pharmacy Med Name: ALPRAZOLAM 0.5 MG TAB] 30 tablet     Sig: TAKE 1 TABLET BY MOUTH AT BEDTIME AS NEEDED      Not Delegated - Psychiatry:  Anxiolytics/Hypnotics Failed - 11/06/2020  7:59 AM      Failed - This refill cannot be delegated      Failed - Urine Drug Screen completed in last 360 days      Passed - Valid encounter within last 6 months    Recent Outpatient Visits           5 months ago Pleuritic chest pain   [place] Flinchum, Kelby Aline, FNP   9 months ago Annual physical exam   [facility] Jerrol Banana., MD   1 year ago [place] Jerrol Banana., MD   1 year ago Annual physical exam   [facility] Jerrol Banana., MD   2 years ago Annual physical exam   [facility] Jerrol Banana., MD       Future Appointments             In 3 months Jerrol Banana., MD [facility], [place]

## 2020-12-03 ENCOUNTER — Telehealth: Payer: Self-pay

## 2020-12-03 NOTE — Telephone Encounter (Signed)
Copied from [place] [id number]. Topic: Appointment Scheduling - Scheduling Inquiry for Clinic >> Dec 03, 2020 12:18 PM Scherrie Gerlach wrote: Reason for CRM: pt would like to know if she can have her cpe rescheduled before Sept 30?  She needs in order to get money from her insurance. Her appt is with Dr Rosanna Randy on 10/05

## 2020-12-09 ENCOUNTER — Other Ambulatory Visit: Payer: Self-pay | Admitting: Family Medicine

## 2020-12-09 DIAGNOSIS — F419 Anxiety disorder, unspecified: Secondary | ICD-10-CM

## 2020-12-10 NOTE — Telephone Encounter (Signed)
Requested medications are due for refill today Filled 7/7, has one refill left  Requested medications are on the active medication list yes  Last refill 7/7  Last visit 02/05/20 (other than for covid)  Future visit scheduled no  Notes to clinic Not Delegated.

## 2020-12-13 NOTE — Telephone Encounter (Signed)
Please advise 

## 2020-12-15 NOTE — Telephone Encounter (Signed)
Rescheduled patient's appointment.

## 2021-01-07 ENCOUNTER — Other Ambulatory Visit: Payer: Self-pay | Admitting: Family Medicine

## 2021-01-07 DIAGNOSIS — F419 Anxiety disorder, unspecified: Secondary | ICD-10-CM

## 2021-01-07 NOTE — Telephone Encounter (Signed)
Requested medication (s) are due for refill today: yes   Requested medication (s) are on the active medication list: yes  Last refill:  11/11/20 #30 1 refill  Future visit scheduled: yes in 1 month   Notes to clinic:  not delegated per protocol      Requested Prescriptions  Pending Prescriptions Disp Refills   ALPRAZolam (XANAX) 0.5 MG tablet [Pharmacy Med Name: ALPRAZOLAM 0.5 MG TAB] 30 tablet     Sig: TAKE 1 TABLET BY MOUTH AT BEDTIME AS NEEDED     Not Delegated - Psychiatry:  Anxiolytics/Hypnotics Failed - 01/07/2021 10:31 AM      Failed - This refill cannot be delegated      Failed - Urine Drug Screen completed in last 360 days      Failed - Valid encounter within last 6 months    Recent Outpatient Visits           7 months ago Pleuritic chest pain   [place] Flinchum, Kelby Aline, FNP   11 months ago Annual physical exam   [facility] Jerrol Banana., MD   1 year ago [place] Jerrol Banana., MD   1 year ago Annual physical exam   [facility] Jerrol Banana., MD   2 years ago Annual physical exam   [facility] Jerrol Banana., MD       Future Appointments             In 1 month Jerrol Banana., MD [facility], [place]

## 2021-01-07 NOTE — Telephone Encounter (Signed)
Please advise refill?  LOV: 02/05/2020 NOV: 01/11/2021 Last refill: 11/11/2020 #30 W/1 REFILL

## 2021-01-11 ENCOUNTER — Ambulatory Visit (INDEPENDENT_AMBULATORY_CARE_PROVIDER_SITE_OTHER): Payer: BC Managed Care – PPO | Admitting: Family Medicine

## 2021-01-11 ENCOUNTER — Other Ambulatory Visit: Payer: Self-pay | Admitting: Family Medicine

## 2021-01-11 ENCOUNTER — Encounter: Payer: Self-pay | Admitting: Family Medicine

## 2021-01-11 ENCOUNTER — Other Ambulatory Visit: Payer: Self-pay

## 2021-01-11 VITALS — BP 110/78 | HR 76 | Temp 98.4°F | Resp 16 | Ht 66.5 in | Wt 158.0 lb

## 2021-01-11 DIAGNOSIS — Z13220 Encounter for screening for lipoid disorders: Secondary | ICD-10-CM | POA: Diagnosis not present

## 2021-01-11 DIAGNOSIS — Z0000 Encounter for general adult medical examination without abnormal findings: Secondary | ICD-10-CM

## 2021-01-11 DIAGNOSIS — F419 Anxiety disorder, unspecified: Secondary | ICD-10-CM

## 2021-01-11 DIAGNOSIS — R200 Anesthesia of skin: Secondary | ICD-10-CM | POA: Diagnosis not present

## 2021-01-11 DIAGNOSIS — R202 Paresthesia of skin: Secondary | ICD-10-CM

## 2021-01-11 DIAGNOSIS — Z79899 Other long term (current) drug therapy: Secondary | ICD-10-CM

## 2021-01-11 NOTE — Telephone Encounter (Signed)
Copied from [place] [id number]. Topic: Quick Communication - Rx Refill/Question >> Jan 11, 2021  4:54 PM Tessa Lerner A wrote: Medication: ALPRAZolam Duanne Moron) 0.5 MG tablet [id number]   Has the patient contacted their pharmacy? Yes.   (Agent: If no, request that the patient contact the pharmacy for the refill.) (Agent: If yes, when and what did the pharmacy advise?)  Preferred Pharmacy (with phone number or street name): [place], [place] - [place]  Phone:  [phone] Fax:  [phone]  Agent: Please be advised that RX refills may take up to 3 business days. We ask that you follow-up with your pharmacy.

## 2021-01-11 NOTE — Progress Notes (Signed)
Complete physical exam   Patient: Tonya Guzman   DOB: 1960-05-15   60 y.o. Female  MRN: [id number] Visit Date: 01/11/2021  Today's healthcare provider: Wilhemena Durie, MD   Chief Complaint  Patient presents with   Annual Exam   Subjective    Tonya Guzman is a 60 y.o. female who presents today for a complete physical exam.  She reports consuming a general diet. Home exercise routine includes walking. She generally feels well. She reports sleeping well. She does not have additional problems to discuss today.  She is a married mother of 2 who has one 33-year-old grandson.  She continues to work in [employer]..  Overall she is feels well.  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   Family history of breast cancer    Fatigue    Insomnia    Night sweats    Past Surgical History:  Procedure Laterality Date   CESAREAN SECTION     COLONOSCOPY  2010   repeat due after 10 yrs   FOOT SURGERY Right    KIDNEY SURGERY     Social History   Socioeconomic History   Marital status: Married    Spouse name: Not on file   Number of children: Not on file   Years of education: Not on file   Highest education level: Not on file  Occupational History   Not on file  Tobacco Use   Smoking status: Former    Packs/day: 0.25    Years: 1.00    Pack years: 0.25    Types: Cigarettes    Quit date: 05/08/2012    Years since quitting: 8.6   Smokeless tobacco: Never  Vaping Use   Vaping Use: Never used  Substance and Sexual Activity   Alcohol use: Yes    Comment: 3 drinks a week   Drug use: No   Sexual activity: Not Currently    Birth control/protection: Post-menopausal  Other Topics Concern   Not on file  Social History Narrative   Not on file   Social Determinants of Health   Financial Resource Strain: Not on file  Food Insecurity: Not on file  Transportation Needs: Not on file  Physical Activity: Not on file  Stress: Not on  file  Social Connections: Not on file  Intimate Partner Violence: Not on file   Family Status  Relation Name Status   Mother  Alive   Father  Deceased at age 32   Sister  63   Brother  Alive   Sister  Alive   Brother  Deceased   [place]  Deceased   [place]  Deceased   Pat Uncle  Deceased   MGM  Deceased   MGF  Deceased   [place]  Deceased   PGF  Deceased   Mat Uncle  Deceased   Mat Uncle x3 Deceased   Son  Alive   Daughter  Alive   Neg Hx  (Not Specified)   Family History  Problem Relation Age of Onset   Pneumonia Mother    Hypertension Mother    Anemia Mother    Hyperlipidemia Father    Hypertension Father    Heart disease Father    Heart attack Father    Cancer Father        vocal   Hypertension Sister    Breast cancer Sister  right breast   Hypertension Sister    Hepatitis C Brother    Breast cancer Maternal Aunt 70   Cancer Maternal Uncle        unk type   Cancer Maternal Uncle        unk type   Ovarian cancer Neg Hx    Colon cancer Neg Hx    Diabetes Neg Hx    No Known Allergies  Patient Care Team: Jerrol Banana., MD as PCP - General (Family Medicine)   Medications: Outpatient Medications Prior to Visit  Medication Sig   ALPRAZolam (XANAX) 0.5 MG tablet TAKE 1 TABLET BY MOUTH AT BEDTIME AS NEEDED   aspirin EC 81 MG tablet Take 81 mg by mouth daily.   letrozole (FEMARA) 2.5 MG tablet Take 2.5 mg by mouth daily.   Magnesium Chloride (MAGNESIUM DR PO) Take by mouth.   NASONEX 50 MCG/ACT nasal spray Reported on 07/21/2015   azithromycin (ZITHROMAX) 250 MG tablet By mouth Take 2 tablets day 1 (596m total) and 1 tablet ( 250 mg ) on days 2,3,4,5.   cyanocobalamin 1000 MCG tablet Take by mouth.   predniSONE (STERAPRED UNI-PAK 21 TAB) 10 MG (21) TBPK tablet PO: Take 6 tablets on day 1:Take 5 tablets day 2:Take 4 tablets day 3: Take 3 tablets day 4:Take 2 tablets day five: 5 Take 1 tablet day 6   No facility-administered medications prior  to visit.    Review of Systems  All other systems reviewed and are negative.     Objective    BP 110/78   Pulse 76   Temp 98.4 F (36.9 C)   Resp 16   Ht 5' 6.5" (1.689 m)   Wt 158 lb (71.7 kg)   BMI 25.12 kg/m  BP Readings from Last 3 Encounters:  01/11/21 110/78  02/05/20 99/76  01/29/20 112/70   Wt Readings from Last 3 Encounters:  01/11/21 158 lb (71.7 kg)  02/05/20 156 lb 6.4 oz (70.9 kg)  01/29/20 157 lb 9.6 oz (71.5 kg)      Physical Exam Vitals reviewed.  Constitutional:      Appearance: She is well-developed.  HENT:     Head: Normocephalic and atraumatic.     Right Ear: External ear normal.     Left Ear: External ear normal.     Nose: Nose normal.  Eyes:     General: No scleral icterus.    Conjunctiva/sclera: Conjunctivae normal.  Neck:     Thyroid: No thyromegaly.  Cardiovascular:     Rate and Rhythm: Normal rate and regular rhythm.     Heart sounds: Normal heart sounds.  Pulmonary:     Effort: Pulmonary effort is normal.     Breath sounds: Normal breath sounds.  Abdominal:     Palpations: Abdomen is soft.  Skin:    General: Skin is warm and dry.  Neurological:     General: No focal deficit present.     Mental Status: She is alert and oriented to person, place, and time.  Psychiatric:        Mood and Affect: Mood normal.        Behavior: Behavior normal.        Thought Content: Thought content normal.        Judgment: Judgment normal.      Last depression screening scores PHQ 2/9 Scores 01/11/2021 02/05/2020 03/18/2019  PHQ - 2 Score 0 0 0  PHQ- 9 Score - 0 1  Last fall risk screening Fall Risk  01/11/2021  Falls in the past year? 0  Number falls in past yr: 0  Injury with Fall? 0  Risk for fall due to : No Fall Risks  Follow up Falls evaluation completed   Last Audit-C alcohol use screening Alcohol Use Disorder Test (AUDIT) 01/11/2021  1. How often do you have a drink containing alcohol? 3  2. How many drinks containing alcohol do  you have on a typical day when you are drinking? 0  3. How often do you have six or more drinks on one occasion? 0  AUDIT-C Score 3  4. How often during the last year have you found that you were not able to stop drinking once you had started? 0  5. How often during the last year have you failed to do what was normally expected from you because of drinking? 0  6. How often during the last year have you needed a first drink in the morning to get yourself going after a heavy drinking session? 0  7. How often during the last year have you had a feeling of guilt of remorse after drinking? 0  8. How often during the last year have you been unable to remember what happened the night before because you had been drinking? 0  9. Have you or someone else been injured as a result of your drinking? 0  10. Has a relative or friend or a doctor or another health worker been concerned about your drinking or suggested you cut down? 0  Alcohol Use Disorder Identification Test Final Score (AUDIT) 3   A score of 3 or more in women, and 4 or more in men indicates increased risk for alcohol abuse, EXCEPT if all of the points are from question 1   No results found for any visits on 01/11/21.  Assessment & Plan    Routine Health Maintenance and Physical Exam  Exercise Activities and Dietary recommendations  Goals   None     Immunization History  Administered Date(s) Administered   Td 01/31/2018   Tdap 03/13/2007    Health Maintenance  Topic Date Due   COVID-19 Vaccine (1) Never done   Pneumococcal Vaccine 34-39 Years old (1 - PCV) Never done   HIV Screening  Never done   Zoster Vaccines- Shingrix (1 of 2) Never done   INFLUENZA VACCINE  Never done   COLONOSCOPY (Pts 45-54yr Insurance coverage will need to be confirmed)  01/04/2021   MAMMOGRAM  03/04/2021   PAP SMEAR-Modifier  01/29/2023   TETANUS/TDAP  02/01/2028   Hepatitis C Screening  Completed   HPV VACCINES  Aged Out    Discussed health  benefits of physical activity, and encouraged her to engage in regular exercise appropriate for her age and condition.  1. Annual physical exam Well woman exam per GYN.  2. Anxiety Patient advised to take as little alprazolam as possible. - TSH - CBC w/Diff/Platelet - Comprehensive Metabolic Panel (CMET)  3. Numbness and tingling Normal neurologic exam today.  This is mainly just in the feet. - B12 and Folate Panel  4. Screening for lipid disorders  - Lipid panel  5. High risk medication use On Femara.  Breast cancer.   No follow-ups on file.     I, RWilhemena Durie MD, have reviewed all documentation for this visit. The documentation on 01/14/21 for the exam, diagnosis, procedures, and orders are all accurate and complete.    Fransheska Willingham GCranford Mon  MD  [facility] [phone] (phone) [phone] (fax)  [place]

## 2021-01-11 NOTE — Patient Instructions (Signed)
Try OTC Natural Calm tea at night.

## 2021-01-12 NOTE — Telephone Encounter (Signed)
Requested medication (s) are due for refill today: Yes  Requested medication (s) are on the active medication list: Yes  Last refill:  11/11/20  Future visit scheduled: Yes  Notes to clinic:  See request.    Requested Prescriptions  Pending Prescriptions Disp Refills   ALPRAZolam (XANAX) 0.5 MG tablet 30 tablet 1    Sig: Take 1 tablet (0.5 mg total) by mouth at bedtime as needed.     Not Delegated - Psychiatry:  Anxiolytics/Hypnotics Failed - 01/11/2021  8:37 PM      Failed - This refill cannot be delegated      Failed - Urine Drug Screen completed in last 360 days      Failed - Valid encounter within last 6 months    Recent Outpatient Visits           Yesterday Annual physical exam   [facility] Jerrol Banana., MD   7 months ago Pleuritic chest pain   [facility] Flinchum, Kelby Aline, FNP   11 months ago Annual physical exam   [facility] Jerrol Banana., MD   1 year ago [place] Jerrol Banana., MD   1 year ago Annual physical exam   [facility] Jerrol Banana., MD       Future Appointments             In 6 months Jerrol Banana., MD [facility], [place]

## 2021-01-14 LAB — CBC WITH DIFFERENTIAL/PLATELET
Basophils Absolute: 0.1 10*3/uL (ref 0.0–0.2)
Basos: 1 %
EOS (ABSOLUTE): 0.1 10*3/uL (ref 0.0–0.4)
Eos: 3 %
Hematocrit: 39.3 % (ref 34.0–46.6)
Hemoglobin: 13.1 g/dL (ref 11.1–15.9)
Immature Grans (Abs): 0 10*3/uL (ref 0.0–0.1)
Immature Granulocytes: 1 %
Lymphocytes Absolute: 1.1 10*3/uL (ref 0.7–3.1)
Lymphs: 27 %
MCH: 31.8 pg (ref 26.6–33.0)
MCHC: 33.3 g/dL (ref 31.5–35.7)
MCV: 95 fL (ref 79–97)
Monocytes Absolute: 0.4 10*3/uL (ref 0.1–0.9)
Monocytes: 10 %
Neutrophils Absolute: 2.3 10*3/uL (ref 1.4–7.0)
Neutrophils: 58 %
Platelets: 232 10*3/uL (ref 150–450)
RBC: 4.12 x10E6/uL (ref 3.77–5.28)
RDW: 13.3 % (ref 11.7–15.4)
WBC: 4 10*3/uL (ref 3.4–10.8)

## 2021-01-14 LAB — LIPID PANEL
Chol/HDL Ratio: 3.1 ratio (ref 0.0–4.4)
Cholesterol, Total: 282 mg/dL — ABNORMAL HIGH (ref 100–199)
HDL: 90 mg/dL (ref >39)
LDL Chol Calc (NIH): 180 mg/dL — ABNORMAL HIGH (ref 0–99)
Triglycerides: 76 mg/dL (ref 0–149)
VLDL Cholesterol Cal: 12 mg/dL (ref 5–40)

## 2021-01-14 LAB — TSH: TSH: 2.92 u[IU]/mL (ref 0.450–4.500)

## 2021-01-14 LAB — COMPREHENSIVE METABOLIC PANEL
ALT: 20 IU/L (ref 0–32)
AST: 28 IU/L (ref 0–40)
Albumin/Globulin Ratio: 2.5 — ABNORMAL HIGH (ref 1.2–2.2)
Albumin: 4.9 g/dL — ABNORMAL HIGH (ref 3.8–4.8)
Alkaline Phosphatase: 53 IU/L (ref 44–121)
BUN/Creatinine Ratio: 21 (ref 12–28)
BUN: 20 mg/dL (ref 8–27)
Bilirubin Total: 0.6 mg/dL (ref 0.0–1.2)
CO2: 25 mmol/L (ref 20–29)
Calcium: 10 mg/dL (ref 8.7–10.3)
Chloride: 102 mmol/L (ref 96–106)
Creatinine, Ser: 0.94 mg/dL (ref 0.57–1.00)
Globulin, Total: 2 g/dL (ref 1.5–4.5)
Glucose: 107 mg/dL — ABNORMAL HIGH (ref 65–99)
Potassium: 4.5 mmol/L (ref 3.5–5.2)
Sodium: 141 mmol/L (ref 134–144)
Total Protein: 6.9 g/dL (ref 6.0–8.5)
eGFR: 69 mL/min/{1.73_m2} (ref >59)

## 2021-01-14 LAB — B12 AND FOLATE PANEL
Folate: 16.5 ng/mL (ref >3.0)
Vitamin B-12: 1637 pg/mL — ABNORMAL HIGH (ref 232–1245)

## 2021-01-31 ENCOUNTER — Ambulatory Visit: Payer: BC Managed Care – PPO | Admitting: Obstetrics and Gynecology

## 2021-02-07 ENCOUNTER — Ambulatory Visit (INDEPENDENT_AMBULATORY_CARE_PROVIDER_SITE_OTHER): Payer: BC Managed Care – PPO | Admitting: Obstetrics and Gynecology

## 2021-02-07 ENCOUNTER — Encounter: Payer: Self-pay | Admitting: Obstetrics and Gynecology

## 2021-02-07 ENCOUNTER — Other Ambulatory Visit: Payer: Self-pay

## 2021-02-07 VITALS — BP 124/70 | Ht 67.0 in | Wt 155.0 lb

## 2021-02-07 DIAGNOSIS — Z01419 Encounter for gynecological examination (general) (routine) without abnormal findings: Secondary | ICD-10-CM

## 2021-02-07 DIAGNOSIS — C50911 Malignant neoplasm of unspecified site of right female breast: Secondary | ICD-10-CM

## 2021-02-07 DIAGNOSIS — Z1231 Encounter for screening mammogram for malignant neoplasm of breast: Secondary | ICD-10-CM | POA: Diagnosis not present

## 2021-02-07 DIAGNOSIS — R3989 Other symptoms and signs involving the genitourinary system: Secondary | ICD-10-CM | POA: Diagnosis not present

## 2021-02-07 DIAGNOSIS — N951 Menopausal and female climacteric states: Secondary | ICD-10-CM | POA: Diagnosis not present

## 2021-02-07 DIAGNOSIS — Z79811 Long term (current) use of aromatase inhibitors: Secondary | ICD-10-CM

## 2021-02-07 DIAGNOSIS — Z1211 Encounter for screening for malignant neoplasm of colon: Secondary | ICD-10-CM

## 2021-02-07 DIAGNOSIS — Z170 Estrogen receptor positive status [ER+]: Secondary | ICD-10-CM

## 2021-02-07 LAB — POCT URINALYSIS DIPSTICK
Bilirubin, UA: NEGATIVE
Blood, UA: NEGATIVE
Glucose, UA: NEGATIVE
Ketones, UA: NEGATIVE
Nitrite, UA: NEGATIVE
Protein, UA: NEGATIVE
Spec Grav, UA: 1.015 (ref 1.010–1.025)
pH, UA: 6 (ref 5.0–8.0)

## 2021-02-07 NOTE — Progress Notes (Signed)
PCP: Jerrol Banana., MD   Chief Complaint  Patient presents with   Gynecologic Exam   Urinary Tract Infection    Painful urination    HPI:      [name] is a 60 y.o. [id number] who LMP was No LMP recorded. Patient is postmenopausal., presents today for her annual examination.  Her menses are absent due to menopause (2015). No pelvic pain or PMB. Tolerable vasomotor sx.    Sex activity--very limited due to dyspareunia: single partner, contraception - post menopausal status. She does have vaginal dryness. She used estrace crm in the past and liked that better than intrarosa, but can't use either anymore due to breast cancer dx last yr. Has tried coconut oil, too   Has had several episodes of bladder pain when voiding first thing in AM over past few months. Voids at night so doesn't feel that bladder is too full when sx occur. No dysuria, frequency/urgency/hematuria otherwise. Has tried AZO with some relief. Drinks 1 coffee daily, after sx have occurred. Only has 1 kidney.  Last Pap: 01/29/20 Results were: no abnormalities /neg HPV DNA.   Last mammogram: 02/23/20  Results were normal, repeat in 12 months at [facility]. Has appt. 03/14/19  Results were: cat 4 RT breast. 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 Doing well There is a FH of breast cancer in her mat aunt and sister, pt is gene panel neg. There is no FH of ovarian cancer. The patient does do self-breast exams.  Colonoscopy: age 27 at [facility]; Repeat due after 10 years. Has appt 11/22.  Tobacco use: The patient denies current or previous tobacco use. Alcohol use: social drinker  No drug use. Exercise: moderately active  DEXA 2/21 at [facility] with oncology: osteopenia, on femara.  She does get adequate calcium but not Vitamin D in her diet. Can't take Vit D or calcium supp due to constipation. Already takes metamucil daily.  Labs with PCP.   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   Family history of breast cancer    Fatigue    Insomnia    Night sweats     Past Surgical History:  Procedure Laterality Date   CESAREAN SECTION     COLONOSCOPY  2010   repeat due after 10 yrs   FOOT SURGERY Right    KIDNEY SURGERY      Family History  Problem Relation Age of Onset   Pneumonia Mother    Hypertension Mother    Anemia Mother    Hyperlipidemia Father    Hypertension Father    Heart disease Father    Heart attack Father    Cancer Father        vocal   Hypertension Sister    Breast cancer Sister        right breast   Hypertension Sister    Hepatitis C Brother    Breast cancer Maternal Aunt 34   Cancer Maternal Uncle        unk type   Cancer Maternal Uncle        unk type   Ovarian cancer Neg Hx    Colon cancer Neg Hx    Diabetes Neg Hx     Social History   Socioeconomic History   Marital status: Married    Spouse name: Not on file  Number of children: Not on file   Years of education: Not on file   Highest education level: Not on file  Occupational History   Not on file  Tobacco Use   Smoking status: Former    Packs/day: 0.25    Years: 1.00    Pack years: 0.25    Types: Cigarettes    Quit date: 05/08/2012    Years since quitting: 8.7   Smokeless tobacco: Never  Vaping Use   Vaping Use: Never used  Substance and Sexual Activity   Alcohol use: Yes    Comment: 3 drinks a week   Drug use: No   Sexual activity: Yes    Birth control/protection: Post-menopausal  Other Topics Concern   Not on file  Social History Narrative   Not on file   Social Determinants of Health   Financial Resource Strain: Not on file  Food Insecurity: Not on file  Transportation Needs: Not on file  Physical Activity: Not on file  Stress: Not on file  Social Connections: Not on file  Intimate Partner Violence: Not on file     Current Outpatient Medications:     ALPRAZolam (XANAX) 0.5 MG tablet, TAKE 1 TABLET BY MOUTH AT BEDTIME AS NEEDED, Disp: 30 tablet, Rfl: 3   aspirin EC 81 MG tablet, Take 81 mg by mouth daily., Disp: , Rfl:    letrozole (FEMARA) 2.5 MG tablet, Take 2.5 mg by mouth daily., Disp: , Rfl:    Magnesium Chloride (MAGNESIUM DR PO), Take by mouth., Disp: , Rfl:    NASONEX 50 MCG/ACT nasal spray, Reported on 07/21/2015, Disp: , Rfl:      ROS:  Review of Systems  Constitutional:  Negative for fatigue, fever and unexpected weight change.  Respiratory:  Negative for cough, shortness of breath and wheezing.   Cardiovascular:  Negative for chest pain, palpitations and leg swelling.  Gastrointestinal:  Negative for blood in stool, constipation, diarrhea, nausea and vomiting.  Endocrine: Negative for cold intolerance, heat intolerance and polyuria.  Genitourinary:  Positive for dyspareunia. Negative for dysuria, flank pain, frequency, genital sores, hematuria, menstrual problem, pelvic pain, urgency, vaginal bleeding, vaginal discharge and vaginal pain.  Musculoskeletal:  Negative for back pain, joint swelling and myalgias.  Skin:  Negative for rash.  Neurological:  Negative for dizziness, syncope, light-headedness, numbness and headaches.  Hematological:  Negative for adenopathy.  Psychiatric/Behavioral:  Negative for agitation, confusion, sleep disturbance and suicidal ideas. The patient is not nervous/anxious.   BREAST: No symptoms    Objective: BP 124/70   Ht 5' 7"  (1.702 m)   Wt 155 lb (70.3 kg)   BMI 24.28 kg/m    Physical Exam Constitutional:      Appearance: She is well-developed.  Genitourinary:     Vulva normal.     Right Labia: No rash, tenderness or lesions.    Left Labia: No tenderness, lesions or rash.    No vaginal discharge, erythema or tenderness.     Moderate vaginal atrophy present.     Right Adnexa: not tender and no mass present.    Left Adnexa: not tender and no mass present.    No cervical  friability or polyp.     Uterus is not enlarged or tender.  Breasts:    Right: No mass, nipple discharge, skin change or tenderness.     Left: No mass, nipple discharge, skin change or tenderness.  Neck:     Thyroid: No thyromegaly.  Cardiovascular:     Rate  and Rhythm: Normal rate and regular rhythm.     Heart sounds: Normal heart sounds. No murmur heard. Pulmonary:     Effort: Pulmonary effort is normal.     Breath sounds: Normal breath sounds.  Abdominal:     Palpations: Abdomen is soft.     Tenderness: There is no abdominal tenderness. There is no guarding or rebound.  Musculoskeletal:        General: Normal range of motion.     Cervical back: Normal range of motion.  Lymphadenopathy:     Cervical: No cervical adenopathy.  Neurological:     General: No focal deficit present.     Mental Status: She is alert and oriented to person, place, and time.     Cranial Nerves: No cranial nerve deficit.  Skin:    General: Skin is warm and dry.  Psychiatric:        Mood and Affect: Mood normal.        Behavior: Behavior normal.        Thought Content: Thought content normal.        Judgment: Judgment normal.  Vitals reviewed.    Results for orders placed or performed in visit on 02/07/21 (from the past 24 hour(s))  POCT Urinalysis Dipstick     Status: Abnormal   Collection Time: 02/07/21  4:59 PM  Result Value Ref Range   Color, UA yellow    Clarity, UA clear    Glucose, UA Negative Negative   Bilirubin, UA neg    Ketones, UA neg    Spec Grav, UA 1.015 1.010 - 1.025   Blood, UA neg    pH, UA 6.0 5.0 - 8.0   Protein, UA Negative Negative   Urobilinogen, UA     Nitrite, UA neg    Leukocytes, UA Trace (A) Negative   Appearance     Odor       Assessment/Plan: Encounter for annual routine gynecological examination  Encounter for screening mammogram for malignant neoplasm of breast; pt has mammo sched.  Screening for colon cancer--has appt 11/22  Vaginal dryness,  menopausal--can't have vag ERT. Try hyaluronic acid vag supp as well as lubricants. Can also try lidocaine crm before sex prn. Pt to f/u.   Malignant neoplasm of right breast in female, estrogen receptor positive, unspecified site of breast (HCC)--followed at [facility].   Use of letrozole (Femara)  Bladder pain - Plan: POCT Urinalysis Dipstick, Urine Culture; Neg UA. sx occas in AM. D/C fluid intake a few hrs before bed to see if helps bladder spasm type sx. Check C&S. F/u prn.          GYN counsel breast self exam, mammography screening, menopause, adequate intake of calcium and vitamin D, diet and exercise    F/U  Return in about 1 year (around 02/07/2022).  Zetta Stoneman B. Gearline Spilman, PA-C 02/07/2021 5:01 PM

## 2021-02-07 NOTE — Patient Instructions (Signed)
I value your feedback and you entrusting us with your care. If you get a  patient survey, I would appreciate you taking the time to let us know about your experience today. Thank you! ? ? ?

## 2021-02-09 ENCOUNTER — Ambulatory Visit: Payer: Self-pay | Admitting: Family Medicine

## 2021-02-11 LAB — URINE CULTURE

## 2021-04-11 ENCOUNTER — Other Ambulatory Visit: Payer: Self-pay | Admitting: Family Medicine

## 2021-04-11 DIAGNOSIS — F419 Anxiety disorder, unspecified: Secondary | ICD-10-CM

## 2021-04-11 NOTE — Telephone Encounter (Signed)
LOV: 01/11/2021  NOV: 07/12/2021  Last Refill: 01/12/2021 #[phone] 0 Refills.   Thanks,   -Mickel Baas

## 2021-07-12 ENCOUNTER — Ambulatory Visit: Payer: BC Managed Care – PPO | Admitting: Family Medicine

## 2021-07-29 NOTE — Progress Notes (Signed)
?  ? ? ?Established patient visit ? ?I,April Miller,acting as a scribe for Wilhemena Durie, MD.,have documented all relevant documentation on the behalf of Wilhemena Durie, MD,as directed by  Wilhemena Durie, MD while in the presence of Wilhemena Durie, MD. ? ? ?Patient: Tonya Guzman   DOB: 10-29-1960   61 y.o. Female  MRN: [id number] ?Visit Date: 08/01/2021 ? ?Today's healthcare provider: Wilhemena Durie, MD  ? ?Chief Complaint  ?Patient presents with  ? Follow-up  ? ?Subjective  ?  ?HPI  ?Patient is here to get follow up labs done. ?She is feeling well.  She plans to work another 5 years or so.  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  She has no specific complaints. ? ?Medications: ?Outpatient Medications Prior to Visit  ?Medication Sig  ? ALPRAZolam (XANAX) 0.5 MG tablet TAKE 1 TABLET BY MOUTH AT BEDTIME AS NEEDED  ? aspirin EC 81 MG tablet Take 81 mg by mouth daily.  ? letrozole (FEMARA) 2.5 MG tablet Take 2.5 mg by mouth daily.  ? Magnesium Chloride (MAGNESIUM DR PO) Take by mouth.  ? NASONEX 50 MCG/ACT nasal spray Reported on 07/21/2015  ? ?No facility-administered medications prior to visit.  ? ? ?Review of Systems  ?Constitutional:  Negative for appetite change, chills, fatigue and fever.  ?Respiratory:  Negative for chest tightness and shortness of breath.   ?Cardiovascular:  Negative for chest pain and palpitations.  ?Gastrointestinal:  Negative for abdominal pain, nausea and vomiting.  ?Neurological:  Negative for dizziness and weakness.  ? ?Last lipids ?Lab Results  ?Component Value Date  ? CHOL 282 (H) 01/13/2021  ? HDL 90 01/13/2021  ? LDLCALC 180 (H) 01/13/2021  ? TRIG 76 01/13/2021  ? CHOLHDL 3.1 01/13/2021  ? ?  ?  Objective  ?  ?BP 114/78 (BP Location: Right Arm, Patient Position: Sitting, Cuff Size: Normal)   Pulse 69   Temp 98 ?F (36.7 ?C) (Temporal)   Resp 14   Ht 5' 6.5" (1.689 m)   Wt 159 lb (72.1 kg)   SpO2 98%   BMI 25.28 kg/m?  ?BP Readings from Last 3 Encounters:   ?08/01/21 114/78  ?02/07/21 124/70  ?01/11/21 110/78  ? ?Wt Readings from Last 3 Encounters:  ?08/01/21 159 lb (72.1 kg)  ?02/07/21 155 lb (70.3 kg)  ?01/11/21 158 lb (71.7 kg)  ? ?  ? ?Physical Exam ?Vitals reviewed.  ?Constitutional:   ?   Appearance: She is well-developed.  ?HENT:  ?   Head: Normocephalic and atraumatic.  ?Eyes:  ?   General: No scleral icterus. ?   Conjunctiva/sclera: Conjunctivae normal.  ?Neck:  ?   Thyroid: No thyromegaly.  ?Cardiovascular:  ?   Rate and Rhythm: Normal rate and regular rhythm.  ?   Heart sounds: Normal heart sounds.  ?Pulmonary:  ?   Effort: Pulmonary effort is normal.  ?   Breath sounds: Normal breath sounds.  ?Abdominal:  ?   Palpations: Abdomen is soft.  ?Musculoskeletal:  ?   Right lower leg: No edema.  ?   Left lower leg: No edema.  ?Skin: ?   General: Skin is warm and dry.  ?Neurological:  ?   General: No focal deficit present.  ?   Mental Status: She is alert and oriented to person, place, and time.  ?Psychiatric:     ?   Mood and Affect: Mood normal.     ?   Behavior: Behavior normal.     ?  Thought Content: Thought content normal.     ?   Judgment: Judgment normal.  ?  ? ? ?No results found for any visits on 08/01/21. ? Assessment & Plan  ?  ? ?1. Elevated LDL cholesterol level ?Consider low-dose Crestor with last LDL being 180 ?- Lipid panel ?- Comprehensive Metabolic Panel (CMET) ?- Hemoglobin A1c ? ?2. Elevated glucose level ?Follow-up A1c ?- Lipid panel ?- Comprehensive Metabolic Panel (CMET) ?- Hemoglobin A1c ? ?3. Malignant neoplasm of upper-outer quadrant of right breast in female, estrogen receptor positive ([place]) ?On letrozole ? ? ?No follow-ups on file.  ?   ? ?I, Wilhemena Durie, MD, have reviewed all documentation for this visit. The documentation on 08/02/21 for the exam, diagnosis, procedures, and orders are all accurate and complete. ? ? ? ?Kymere Fullington Cranford Mon, MD  ?[facility] ?[phone] (phone) ?[phone] (fax) ? ?[facility] ?

## 2021-08-01 ENCOUNTER — Other Ambulatory Visit: Payer: Self-pay

## 2021-08-01 ENCOUNTER — Ambulatory Visit (INDEPENDENT_AMBULATORY_CARE_PROVIDER_SITE_OTHER): Payer: BC Managed Care – PPO | Admitting: Family Medicine

## 2021-08-01 ENCOUNTER — Encounter: Payer: Self-pay | Admitting: Family Medicine

## 2021-08-01 VITALS — BP 114/78 | HR 69 | Temp 98.0°F | Resp 14 | Ht 66.5 in | Wt 159.0 lb

## 2021-08-01 DIAGNOSIS — C50411 Malignant neoplasm of upper-outer quadrant of right female breast: Secondary | ICD-10-CM | POA: Diagnosis not present

## 2021-08-01 DIAGNOSIS — R7309 Other abnormal glucose: Secondary | ICD-10-CM

## 2021-08-01 DIAGNOSIS — E7800 Pure hypercholesterolemia, unspecified: Secondary | ICD-10-CM | POA: Diagnosis not present

## 2021-08-01 DIAGNOSIS — Z170 Estrogen receptor positive status [ER+]: Secondary | ICD-10-CM

## 2021-08-11 LAB — COMPREHENSIVE METABOLIC PANEL
ALT: 16 IU/L (ref 0–32)
AST: 18 IU/L (ref 0–40)
Albumin/Globulin Ratio: 1.8 (ref 1.2–2.2)
Albumin: 4.5 g/dL (ref 3.8–4.8)
Alkaline Phosphatase: 57 IU/L (ref 44–121)
BUN/Creatinine Ratio: 20 (ref 12–28)
BUN: 21 mg/dL (ref 8–27)
Bilirubin Total: 0.4 mg/dL (ref 0.0–1.2)
CO2: 22 mmol/L (ref 20–29)
Calcium: 9.5 mg/dL (ref 8.7–10.3)
Chloride: 106 mmol/L (ref 96–106)
Creatinine, Ser: 1.03 mg/dL — ABNORMAL HIGH (ref 0.57–1.00)
Globulin, Total: 2.5 g/dL (ref 1.5–4.5)
Glucose: 116 mg/dL — ABNORMAL HIGH (ref 70–99)
Potassium: 4.4 mmol/L (ref 3.5–5.2)
Sodium: 142 mmol/L (ref 134–144)
Total Protein: 7 g/dL (ref 6.0–8.5)
eGFR: 61 mL/min/{1.73_m2} (ref >59)

## 2021-08-11 LAB — LIPID PANEL
Chol/HDL Ratio: 3.1 ratio (ref 0.0–4.4)
Cholesterol, Total: 267 mg/dL — ABNORMAL HIGH (ref 100–199)
HDL: 86 mg/dL (ref >39)
LDL Chol Calc (NIH): 171 mg/dL — ABNORMAL HIGH (ref 0–99)
Triglycerides: 62 mg/dL (ref 0–149)
VLDL Cholesterol Cal: 10 mg/dL (ref 5–40)

## 2021-08-11 LAB — HEMOGLOBIN A1C
Est. average glucose Bld gHb Est-mCnc: 117 mg/dL
Hgb A1c MFr Bld: 5.7 % — ABNORMAL HIGH (ref 4.8–5.6)

## 2021-08-31 ENCOUNTER — Other Ambulatory Visit: Payer: Self-pay | Admitting: Family Medicine

## 2021-08-31 DIAGNOSIS — F419 Anxiety disorder, unspecified: Secondary | ICD-10-CM

## 2021-09-20 ENCOUNTER — Encounter: Payer: Self-pay | Admitting: Family Medicine

## 2021-09-20 ENCOUNTER — Ambulatory Visit (INDEPENDENT_AMBULATORY_CARE_PROVIDER_SITE_OTHER): Payer: BC Managed Care – PPO | Admitting: Family Medicine

## 2021-09-20 VITALS — BP 102/70 | Temp 98.5°F | Resp 15 | Wt 160.1 lb

## 2021-09-20 DIAGNOSIS — F419 Anxiety disorder, unspecified: Secondary | ICD-10-CM | POA: Diagnosis not present

## 2021-09-20 DIAGNOSIS — C50411 Malignant neoplasm of upper-outer quadrant of right female breast: Secondary | ICD-10-CM | POA: Diagnosis not present

## 2021-09-20 DIAGNOSIS — Z170 Estrogen receptor positive status [ER+]: Secondary | ICD-10-CM | POA: Diagnosis not present

## 2021-09-20 DIAGNOSIS — R1011 Right upper quadrant pain: Secondary | ICD-10-CM | POA: Diagnosis not present

## 2021-09-20 LAB — POCT URINALYSIS DIPSTICK
Bilirubin, UA: NEGATIVE
Blood, UA: NEGATIVE
Glucose, UA: NEGATIVE
Ketones, UA: NEGATIVE
Leukocytes, UA: NEGATIVE
Nitrite, UA: NEGATIVE
Protein, UA: NEGATIVE
Spec Grav, UA: 1.01 (ref 1.010–1.025)
Urobilinogen, UA: 0.2 E.U./dL
pH, UA: 6 (ref 5.0–8.0)

## 2021-09-20 NOTE — Progress Notes (Signed)
Established patient visit   Patient: Tonya Guzman   DOB: November 21, 1960   61 y.o. Female  MRN: [id number] Visit Date: 09/20/2021  Today's healthcare provider: Wilhemena Durie, MD   Clint Bolder as a scribe for Wilhemena Durie, MD.,have documented all relevant documentation on the behalf of Wilhemena Durie, MD,as directed by  Wilhemena Durie, MD while in the presence of Wilhemena Durie, MD.  Chief Complaint  Patient presents with   Abdominal Pain   Subjective    Abdominal Pain This is a recurrent problem. The current episode started more than 1 month ago. The problem occurs intermittently. The problem has been unchanged. The pain is located in the RUQ. The pain is at a severity of 3/10. The quality of the pain is sharp and dull. The abdominal pain does not radiate. Associated symptoms include arthralgias (right side). Pertinent negatives include no anorexia, belching, constipation, diarrhea, dysuria, fever, flatus, frequency, headaches, hematochezia, hematuria, melena, myalgias, nausea, vomiting or weight loss. Nothing aggravates the pain. The pain is relieved by Nothing. She has tried nothing for the symptoms. Her past medical history is significant for abdominal surgery (c-section x2). There is no history of colon cancer, Crohn's disease, gallstones, GERD, irritable bowel syndrome, pancreatitis, PUD or ulcerative colitis.   Patient is status post right nephrectomy years ago status post C-section x2. He has right-sided abdominal and flank pain that is worse with movement.  GI or GU symptoms.  Medications: Outpatient Medications Prior to Visit  Medication Sig   ALPRAZolam (XANAX) 0.5 MG tablet TAKE 1 TABLET BY MOUTH AT BEDTIME AS NEEDED   aspirin EC 81 MG tablet Take 81 mg by mouth daily.   letrozole (FEMARA) 2.5 MG tablet Take 2.5 mg by mouth daily.   Magnesium Chloride (MAGNESIUM DR PO) Take by mouth.   NASONEX 50 MCG/ACT nasal spray Reported on 07/21/2015    No facility-administered medications prior to visit.    Review of Systems  Constitutional:  Negative for fever and weight loss.  Gastrointestinal:  Positive for abdominal pain. Negative for anorexia, constipation, diarrhea, flatus, hematochezia, melena, nausea and vomiting.  Genitourinary:  Negative for dysuria, frequency and hematuria.  Musculoskeletal:  Positive for arthralgias (right side). Negative for myalgias.  Neurological:  Negative for headaches.   Last metabolic panel Lab Results  Component Value Date   GLUCOSE 97 09/20/2021   NA 144 09/20/2021   K 4.8 09/20/2021   CL 104 09/20/2021   CO2 23 09/20/2021   BUN 17 09/20/2021   CREATININE 0.94 09/20/2021   EGFR 69 09/20/2021   CALCIUM 9.6 09/20/2021   PROT 6.8 09/20/2021   ALBUMIN 4.6 09/20/2021   LABGLOB 2.2 09/20/2021   AGRATIO 2.1 09/20/2021   BILITOT 0.4 09/20/2021   ALKPHOS 56 09/20/2021   AST 18 09/20/2021   ALT 24 09/20/2021       Objective    BP 102/70   Temp 98.5 F (36.9 C) (Oral)   Resp 15   Wt 160 lb 1.6 oz (72.6 kg)   BMI 25.45 kg/m  BP Readings from Last 3 Encounters:  09/20/21 102/70  08/01/21 114/78  02/07/21 124/70   Wt Readings from Last 3 Encounters:  09/20/21 160 lb 1.6 oz (72.6 kg)  08/01/21 159 lb (72.1 kg)  02/07/21 155 lb (70.3 kg)      Physical Exam Vitals reviewed.  Constitutional:      Appearance: She is well-developed.  HENT:     Head:  Normocephalic and atraumatic.  Eyes:     General: No scleral icterus.    Conjunctiva/sclera: Conjunctivae normal.  Neck:     Thyroid: No thyromegaly.  Cardiovascular:     Rate and Rhythm: Normal rate and regular rhythm.     Heart sounds: Normal heart sounds.  Pulmonary:     Effort: Pulmonary effort is normal.     Breath sounds: Normal breath sounds.  Abdominal:     Palpations: Abdomen is soft.     Tenderness: There is abdominal tenderness in the right upper quadrant.     Comments: Mild right upper quadrant tenderness   Musculoskeletal:     Right lower leg: No edema.     Left lower leg: No edema.  Skin:    General: Skin is warm and dry.  Neurological:     General: No focal deficit present.     Mental Status: She is alert and oriented to person, place, and time.  Psychiatric:        Mood and Affect: Mood normal.        Behavior: Behavior normal.        Thought Content: Thought content normal.        Judgment: Judgment normal.      Results for orders placed or performed in visit on 09/20/21  POCT urinalysis dipstick  Result Value Ref Range   Color, UA dark yellow    Clarity, UA clear    Glucose, UA Negative Negative   Bilirubin, UA negative    Ketones, UA negative    Spec Grav, UA 1.010 1.010 - 1.025   Blood, UA negative    pH, UA 6.0 5.0 - 8.0   Protein, UA Negative Negative   Urobilinogen, UA 0.2 0.2 or 1.0 E.U./dL   Nitrite, UA negative    Leukocytes, UA Negative Negative   Appearance     Odor      Assessment & Plan     1. Right upper quadrant abdominal pain Lab work and gallbladder ultrasound.  This could be musculoskeletal. - POCT urinalysis dipstick - CBC with Differential/Platelet - Comprehensive metabolic panel - US Abdomen Limited RUQ (LIVER/GB); Future  2. Malignant neoplasm of upper-outer quadrant of right breast in female, estrogen receptor positive ([place]) On Femara  3. Anxiety On Xanax as needed for sleep.   No follow-ups on file.      I, Wilhemena Durie, MD, have reviewed all documentation for this visit. The documentation on 09/23/21 for the exam, diagnosis, procedures, and orders are all accurate and complete.    Jailin Moomaw Cranford Mon, MD  [facility] [phone] (phone) [phone] (fax)  [place]

## 2021-09-20 NOTE — Patient Instructions (Signed)
STOP MILK THISTLE  ? ? ? ? ?Abdominal Pain, Adult ?Pain in the abdomen (abdominal pain) can be caused by many things. Often, abdominal pain is not serious and it gets better with no treatment or by being treated at home. However, sometimes abdominal pain is serious. ?Your health care provider will ask questions about your medical history and do a physical exam to try to determine the cause of your abdominal pain. ?Follow these instructions at home: ?Medicines ?Take over-the-counter and prescription medicines only as told by your health care provider. ?Do not take a laxative unless told by your health care provider. ?General instructions ? ?Watch your condition for any changes. ?Drink enough fluid to keep your urine pale yellow. ?Keep all follow-up visits as told by your health care provider. This is important. ?Contact a health care provider if: ?Your abdominal pain changes or gets worse. ?You are not hungry or you lose weight without trying. ?You are constipated or have diarrhea for more than 2-3 days. ?You have pain when you urinate or have a bowel movement. ?Your abdominal pain wakes you up at night. ?Your pain gets worse with meals, after eating, or with certain foods. ?You are vomiting and cannot keep anything down. ?You have a fever. ?You have blood in your urine. ?Get help right away if: ?Your pain does not go away as soon as your health care provider told you to expect. ?You cannot stop vomiting. ?Your pain is only in areas of the abdomen, such as the right side or the left lower portion of the abdomen. Pain on the right side could be caused by appendicitis. ?You have bloody or black stools, or stools that look like tar. ?You have severe pain, cramping, or bloating in your abdomen. ?You have signs of dehydration, such as: ?Dark urine, very little urine, or no urine. ?Cracked lips. ?Dry mouth. ?Sunken eyes. ?Sleepiness. ?Weakness. ?You have trouble breathing or chest pain. ?Summary ?Often, abdominal pain is  not serious and it gets better with no treatment or by being treated at home. However, sometimes abdominal pain is serious. ?Watch your condition for any changes. ?Take over-the-counter and prescription medicines only as told by your health care provider. ?Contact a health care provider if your abdominal pain changes or gets worse. ?Get help right away if you have severe pain, cramping, or bloating in your abdomen. ?This information is not intended to replace advice given to you by your health care provider. Make sure you discuss any questions you have with your health care provider. ?Document Revised: 06/13/2019 Document Reviewed: 09/02/2018 ?Elsevier Patient Education ? [employer] ? ?

## 2021-09-21 LAB — COMPREHENSIVE METABOLIC PANEL
ALT: 24 IU/L (ref 0–32)
AST: 18 IU/L (ref 0–40)
Albumin/Globulin Ratio: 2.1 (ref 1.2–2.2)
Albumin: 4.6 g/dL (ref 3.8–4.8)
Alkaline Phosphatase: 56 IU/L (ref 44–121)
BUN/Creatinine Ratio: 18 (ref 12–28)
BUN: 17 mg/dL (ref 8–27)
Bilirubin Total: 0.4 mg/dL (ref 0.0–1.2)
CO2: 23 mmol/L (ref 20–29)
Calcium: 9.6 mg/dL (ref 8.7–10.3)
Chloride: 104 mmol/L (ref 96–106)
Creatinine, Ser: 0.94 mg/dL (ref 0.57–1.00)
Globulin, Total: 2.2 g/dL (ref 1.5–4.5)
Glucose: 97 mg/dL (ref 70–99)
Potassium: 4.8 mmol/L (ref 3.5–5.2)
Sodium: 144 mmol/L (ref 134–144)
Total Protein: 6.8 g/dL (ref 6.0–8.5)
eGFR: 69 mL/min/{1.73_m2} (ref >59)

## 2021-09-21 LAB — CBC WITH DIFFERENTIAL/PLATELET
Basophils Absolute: 0.1 10*3/uL (ref 0.0–0.2)
Basos: 1 %
EOS (ABSOLUTE): 0.2 10*3/uL (ref 0.0–0.4)
Eos: 4 %
Hematocrit: 35.9 % (ref 34.0–46.6)
Hemoglobin: 12.6 g/dL (ref 11.1–15.9)
Immature Grans (Abs): 0 10*3/uL (ref 0.0–0.1)
Immature Granulocytes: 0 %
Lymphocytes Absolute: 1.2 10*3/uL (ref 0.7–3.1)
Lymphs: 23 %
MCH: 32.1 pg (ref 26.6–33.0)
MCHC: 35.1 g/dL (ref 31.5–35.7)
MCV: 91 fL (ref 79–97)
Monocytes Absolute: 0.5 10*3/uL (ref 0.1–0.9)
Monocytes: 10 %
Neutrophils Absolute: 3.2 10*3/uL (ref 1.4–7.0)
Neutrophils: 62 %
Platelets: 225 10*3/uL (ref 150–450)
RBC: 3.93 x10E6/uL (ref 3.77–5.28)
RDW: 13.2 % (ref 11.7–15.4)
WBC: 5.2 10*3/uL (ref 3.4–10.8)

## 2021-09-30 ENCOUNTER — Ambulatory Visit: Payer: BC Managed Care – PPO

## 2021-10-19 IMAGING — MG US  BREAST BX W/ LOC DEV 1ST LESION IMG BX SPEC US GUIDE*R*
1 series · 8 of 8 positions shown · non-contrast
Comparison: Previous exam(s).
COMPARISON: Previous exam(s).

Addendum:
CLINICAL DATA: Patient with indeterminate right breast mass 12:30
o'clock

EXAM:
ULTRASOUND GUIDED RIGHT BREAST CORE NEEDLE BIOPSY

[Series 1: MG view · 0.06mm/px · 8 of 8 slices shown]
[im 1/8]
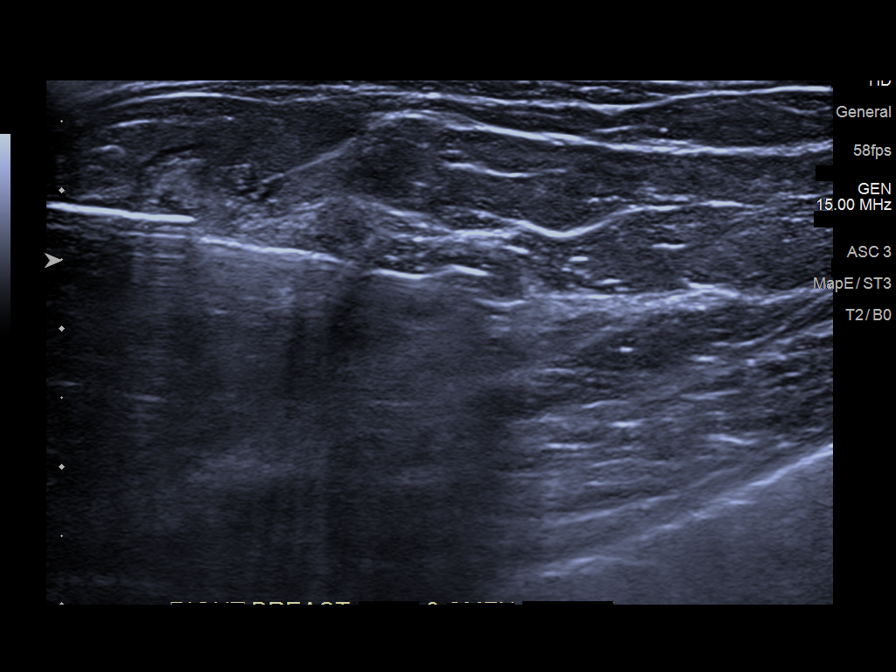
[im 2/8]
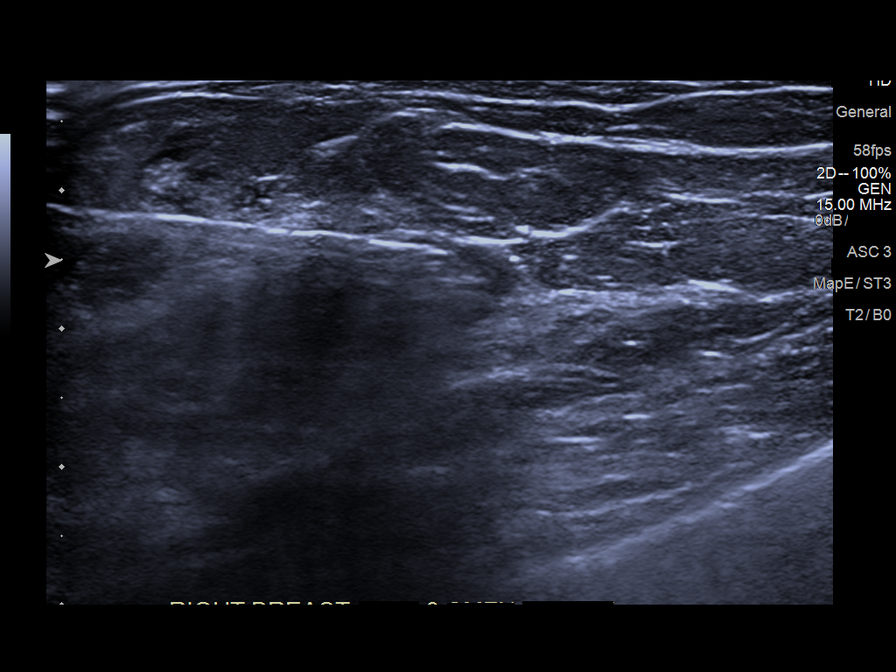
[im 3/8]
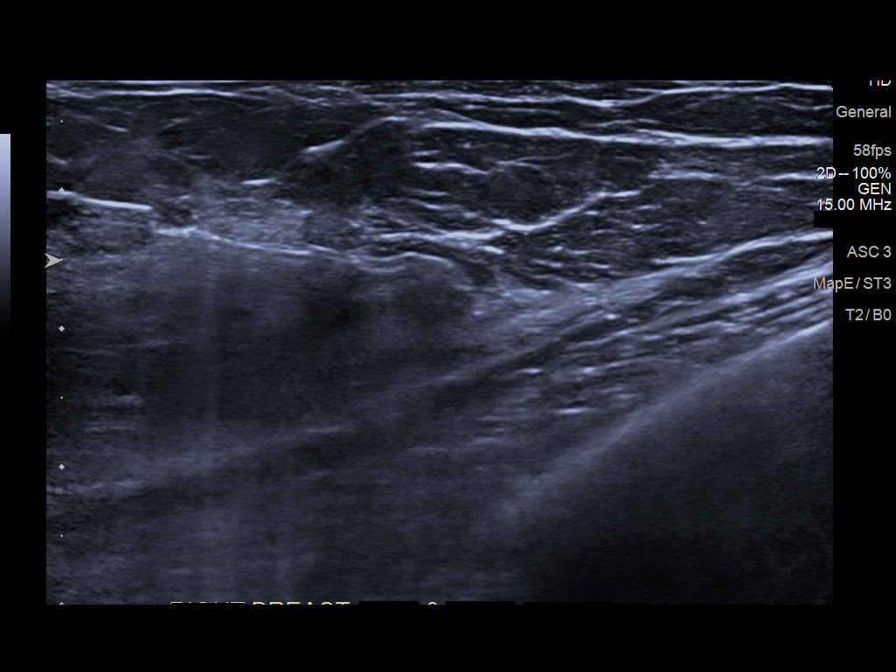
[im 4/8]
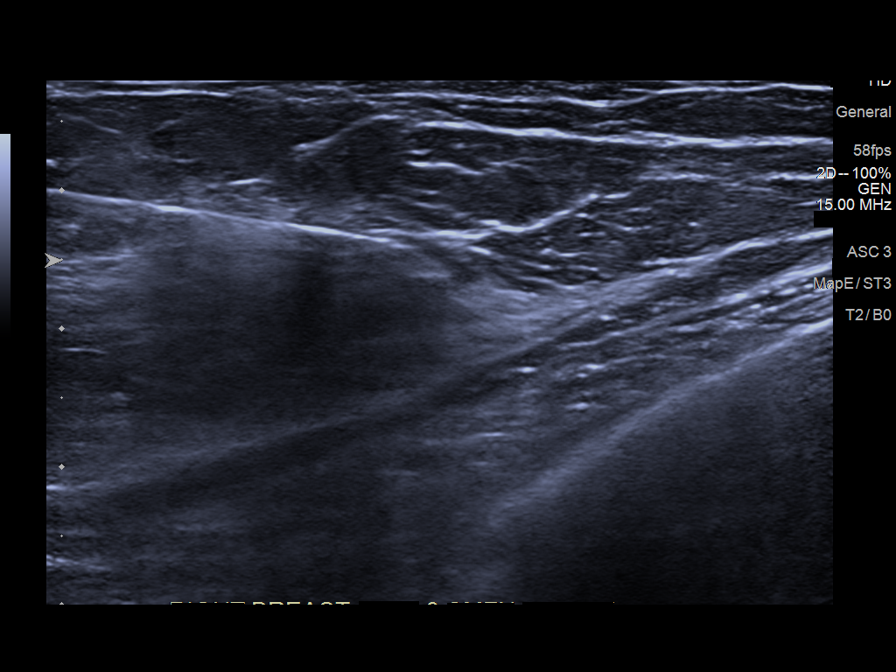
[im 5/8]
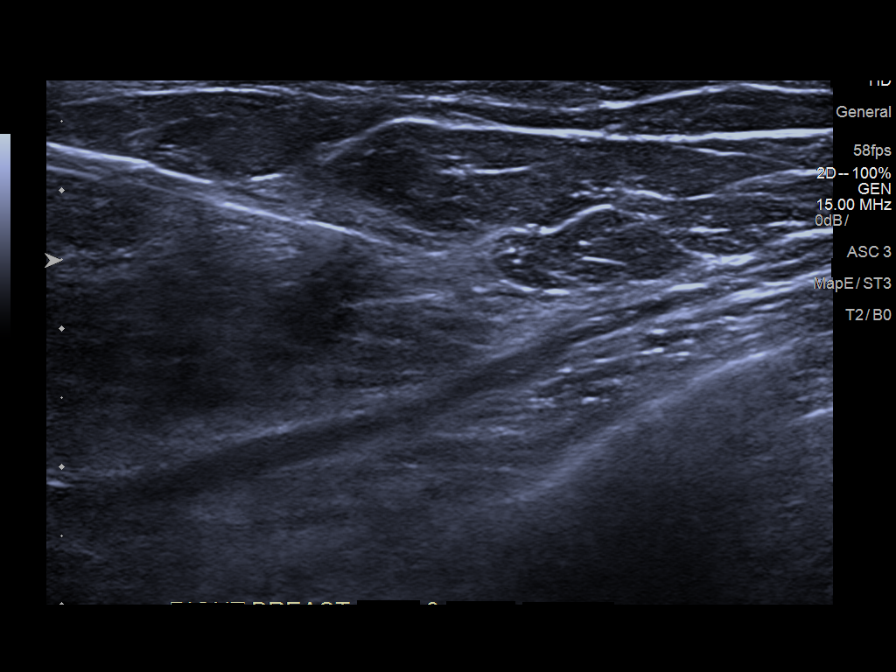
[im 6/8]
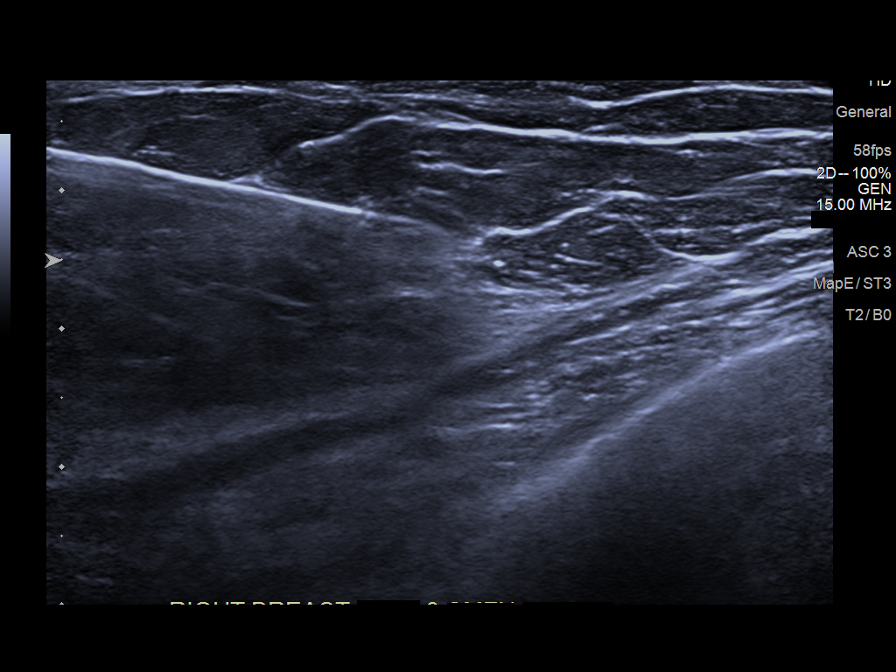
[im 7/8]
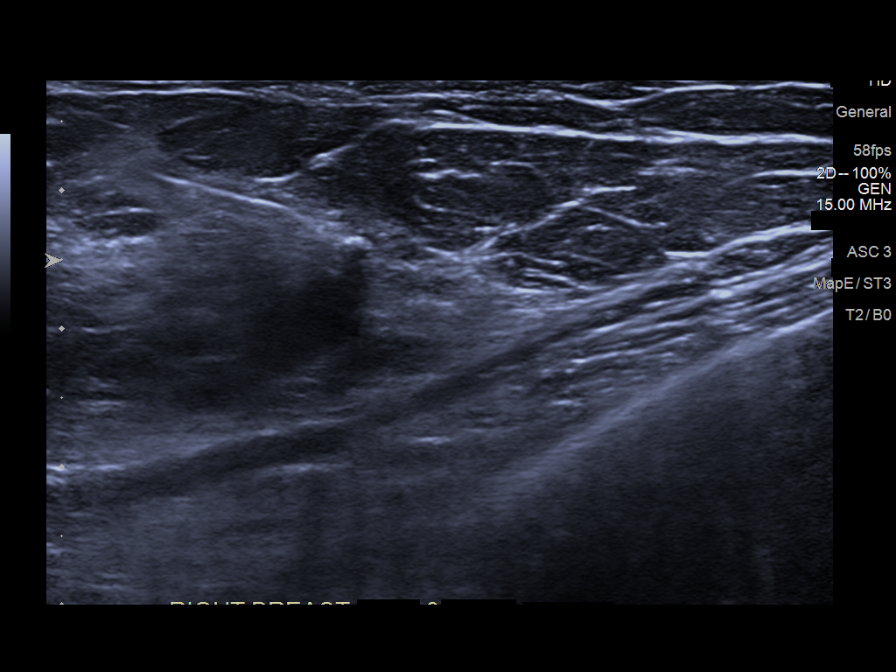
[im 8/8]
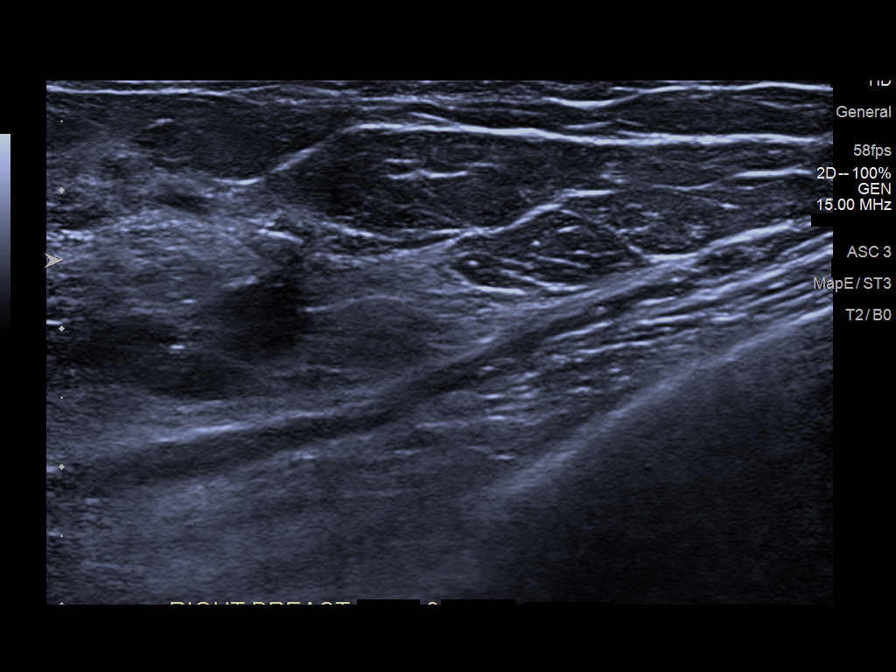

[8 of 8 positions shown; findings below may reference images not displayed]



Lesion quadrant: Upper inner quadrant

Using sterile technique and 1% Lidocaine as local anesthetic, under
direct ultrasound visualization, a 14 gauge Edwin Javier device was
used to perform biopsy of right breast mass 12:30 o'clock using a
medial approach. At the conclusion of the procedure heart shaped
tissue marker clip was deployed into the biopsy cavity. Follow up 2
view mammogram was performed and dictated separately.
IMPRESSION: Ultrasound guided biopsy of right breast mass 12:30 o'clock. No
apparent complications.

ADDENDUM:
PATHOLOGY revealed: A. BREAST, RIGHT, 12:30 O'CLOCK 6 CM FROM
NIPPLE; ULTRASOUND-GUIDED CORE BIOPSY: - INVASIVE MAMMARY CARCINOMA,
NO SPECIAL TYPE. 4 mm in this sample. Grade 1. Ductal carcinoma in
situ: Present, intermediate nuclear grade without necrosis.
Lymphovascular invasion: Not identified.

Pathology results are CONCORDANT with imaging findings, per Dr. Corliss
Naadhi.

Pathology results were discussed with patient via telephone. The
patient reported doing well after the biopsy with no adverse
symptoms, only tenderness at the site. Post biopsy care instructions
were reviewed and questions were answered. The patient was
encouraged to call [HOSPITAL] for any additional
questions or concerns.

Recommendation: Surgical referral. Request for surgical referral was
relayed to nurse navigators at [HOSPITAL] [HOSPITAL] by
Nazareth Jumper RN on 03/20/2019.

Addendum by Nazareth Jumper RN on 03/21/2019.



Lesion quadrant: Upper inner quadrant

Using sterile technique and 1% Lidocaine as local anesthetic, under
direct ultrasound visualization, a 14 gauge Edwin Javier device was
used to perform biopsy of right breast mass 12:30 o'clock using a
medial approach. At the conclusion of the procedure heart shaped
tissue marker clip was deployed into the biopsy cavity. Follow up 2
view mammogram was performed and dictated separately.
IMPRESSION: Ultrasound guided biopsy of right breast mass 12:30 o'clock. No
apparent complications.

## 2021-11-02 ENCOUNTER — Telehealth: Payer: Self-pay | Admitting: *Deleted

## 2021-11-02 NOTE — Telephone Encounter (Signed)
LMTCB-PT Pt cancelled last appointment made and did not reschedule

## 2021-11-02 NOTE — Telephone Encounter (Signed)
Copied from [place]. Topic: General - Other >> Nov 02, 2021  8:24 AM Leilani Able wrote: Reason for CRM: Pls fu with pt for resch of imaging appt on 5/26 call pt at [phone]

## 2021-11-02 NOTE — Telephone Encounter (Signed)
Pt given contact information to reschedule

## 2021-11-02 NOTE — Telephone Encounter (Signed)
Please advise imaging appt?

## 2021-11-10 ENCOUNTER — Ambulatory Visit
Admission: RE | Admit: 2021-11-10 | Discharge: 2021-11-10 | Disposition: A | Discharge status: Home / Self Care | Payer: BC Managed Care – PPO | Source: Ambulatory Visit | Attending: Family Medicine | Admitting: Family Medicine

## 2021-11-10 DIAGNOSIS — R1011 Right upper quadrant pain: Secondary | ICD-10-CM | POA: Diagnosis present

## 2021-11-11 ENCOUNTER — Telehealth: Payer: Self-pay

## 2021-11-11 NOTE — Telephone Encounter (Signed)
[place] results are in. Provider needs to review them

## 2021-11-11 NOTE — Telephone Encounter (Signed)
Provider to review them first before going over [place] results.

## 2021-11-11 NOTE — Telephone Encounter (Signed)
Copied from [place] [phone]. Topic: General - Other >> Nov 11, 2021 11:31 AM Cyndi Bender wrote: Reason for CRM: Pt requests call back to go  over ultrasound results. Cb# [phone]

## 2021-11-11 NOTE — Telephone Encounter (Signed)
Copied from [place] [id number]. Topic: General - Other >> Nov 10, 2021  4:07 PM Chapman Fitch wrote: Reason for CRM: Pt called stating that someone from the office called her and she wasn't sure why or it was about her US/ please advise

## 2021-11-14 ENCOUNTER — Other Ambulatory Visit: Payer: Self-pay

## 2021-11-14 ENCOUNTER — Other Ambulatory Visit: Payer: Self-pay | Admitting: *Deleted

## 2021-11-14 DIAGNOSIS — R9389 Abnormal findings on diagnostic imaging of other specified body structures: Secondary | ICD-10-CM

## 2021-11-14 DIAGNOSIS — R6889 Other general symptoms and signs: Secondary | ICD-10-CM

## 2021-11-15 ENCOUNTER — Other Ambulatory Visit: Payer: Self-pay | Admitting: *Deleted

## 2021-11-15 ENCOUNTER — Telehealth: Payer: Self-pay

## 2021-11-15 DIAGNOSIS — R1011 Right upper quadrant pain: Secondary | ICD-10-CM

## 2021-11-15 NOTE — Telephone Encounter (Signed)
Copied from [place] [phone]. Topic: General - Other >> Nov 15, 2021  3:23 PM Burman Freestone wrote: Reason for CRM: Per [facility] pt will need a lab order to check kidney functions before CT

## 2021-11-15 NOTE — Telephone Encounter (Signed)
Lab was ordered

## 2021-11-18 ENCOUNTER — Ambulatory Visit
Admission: RE | Admit: 2021-11-18 | Discharge: 2021-11-18 | Disposition: A | Discharge status: Home / Self Care | Payer: BC Managed Care – PPO | Source: Ambulatory Visit | Attending: Family Medicine | Admitting: Family Medicine

## 2021-11-18 DIAGNOSIS — R9389 Abnormal findings on diagnostic imaging of other specified body structures: Secondary | ICD-10-CM | POA: Diagnosis present

## 2021-11-18 LAB — POCT I-STAT CREATININE: Creatinine, Ser: 0.9 mg/dL (ref 0.44–1.00)

## 2021-11-18 MED ORDER — IOHEXOL 300 MG/ML  SOLN
85.0000 mL | Freq: Once | INTRAMUSCULAR | Status: AC | PRN
  Administered 2021-11-18: 85 mL via INTRAVENOUS

## 2021-11-21 ENCOUNTER — Telehealth: Payer: Self-pay

## 2021-11-21 NOTE — Telephone Encounter (Signed)
Copied from [place] [phone]. Topic: General - Other >> Nov 21, 2021  4:23 PM Leitha Schuller wrote: Reason for CRM: Patient checking status of 11-14-2021 imaging results  Please fu w/ patient once results are resulted

## 2021-11-21 NOTE — Telephone Encounter (Signed)
Please advise 

## 2021-11-23 ENCOUNTER — Encounter: Payer: Self-pay | Admitting: Family Medicine

## 2021-12-01 ENCOUNTER — Other Ambulatory Visit: Payer: Self-pay | Admitting: Family Medicine

## 2021-12-01 DIAGNOSIS — F419 Anxiety disorder, unspecified: Secondary | ICD-10-CM

## 2021-12-07 ENCOUNTER — Encounter: Payer: Self-pay | Admitting: Family Medicine

## 2021-12-07 ENCOUNTER — Ambulatory Visit (INDEPENDENT_AMBULATORY_CARE_PROVIDER_SITE_OTHER): Payer: BC Managed Care – PPO | Admitting: Family Medicine

## 2021-12-07 VITALS — BP 102/80 | HR 89 | Resp 16 | Wt 159.0 lb

## 2021-12-07 DIAGNOSIS — C50411 Malignant neoplasm of upper-outer quadrant of right female breast: Secondary | ICD-10-CM | POA: Diagnosis not present

## 2021-12-07 DIAGNOSIS — Z170 Estrogen receptor positive status [ER+]: Secondary | ICD-10-CM

## 2021-12-07 DIAGNOSIS — R1011 Right upper quadrant pain: Secondary | ICD-10-CM | POA: Diagnosis not present

## 2021-12-07 DIAGNOSIS — H60543 Acute eczematoid otitis externa, bilateral: Secondary | ICD-10-CM

## 2021-12-07 MED ORDER — MOMETASONE FUROATE 0.1 % EX CREA: TOPICAL_CREAM | CUTANEOUS | 1 refills | Status: DC

## 2021-12-07 NOTE — Progress Notes (Signed)
Established patient visit  I,April Miller,acting as a scribe for Wilhemena Durie, MD.,have documented all relevant documentation on the behalf of Wilhemena Durie, MD,as directed by  Wilhemena Durie, MD while in the presence of Wilhemena Durie, MD.   Patient: Tonya Guzman   DOB: [birth year]   61 y.o. Female  MRN: [id number] Visit Date: 12/07/2021  Today's healthcare provider: Wilhemena Durie, MD   No chief complaint on file.  Subjective    HPI  Patient is here concerning kidney stones. Patient has had ultrasound followed by CT scan.  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 The pain is only with certain movements, mainly twisting movements of the right lower lateral chest wall.  No pleuritic pain. The other issue she has is itching of both EAC openings  Medications: Outpatient Medications Prior to Visit  Medication Sig   ALPRAZolam (XANAX) 0.5 MG tablet TAKE 1 TABLET BY MOUTH AT BEDTIME AS NEEDED   aspirin EC 81 MG tablet Take 81 mg by mouth daily.   letrozole (FEMARA) 2.5 MG tablet Take 2.5 mg by mouth daily.   Magnesium Chloride (MAGNESIUM DR PO) Take by mouth.   NASONEX 50 MCG/ACT nasal spray Reported on 07/21/2015   No facility-administered medications prior to visit.    Review of Systems  Constitutional:  Negative for appetite change, chills, fatigue and fever.  Respiratory:  Negative for chest tightness and shortness of breath.   Cardiovascular:  Negative for chest pain and palpitations.  Gastrointestinal:  Negative for abdominal pain, nausea and vomiting.  Neurological:  Negative for dizziness and weakness.    Last metabolic panel Lab Results  Component Value Date   GLUCOSE 97 09/20/2021   NA 144 09/20/2021   K 4.8 09/20/2021   CL 104 09/20/2021   CO2 23 09/20/2021   BUN 17 09/20/2021   CREATININE 0.90 11/18/2021   EGFR 69  09/20/2021   CALCIUM 9.6 09/20/2021   PROT 6.8 09/20/2021   ALBUMIN 4.6 09/20/2021   LABGLOB 2.2 09/20/2021   AGRATIO 2.1 09/20/2021   BILITOT 0.4 09/20/2021   ALKPHOS 56 09/20/2021   AST 18 09/20/2021   ALT 24 09/20/2021       Objective    BP 102/80 (BP Location: Left Arm, Patient Position: Sitting, Cuff Size: Normal)   Pulse 89   Resp 16   Wt 159 lb (72.1 kg)   SpO2 98%   BMI 25.28 kg/m  BP Readings from Last 3 Encounters:  12/07/21 102/80  09/20/21 102/70  08/01/21 114/78   Wt Readings from Last 3 Encounters:  12/07/21 159 lb (72.1 kg)  09/20/21 160 lb 1.6 oz (72.6 kg)  08/01/21 159 lb (72.1 kg)      Physical Exam Vitals reviewed.  Constitutional:      General: She is not in acute distress.    Appearance: She is well-developed.  HENT:     Head: Normocephalic and atraumatic.     Right Ear: Hearing normal.     Left Ear: Hearing normal.     Ears:     Comments: Minimal eczema both EACs    Nose: Nose normal.  Eyes:     General: Lids are normal. No scleral icterus.       Right eye: No discharge.        Left eye: No discharge.  Conjunctiva/sclera: Conjunctivae normal.  Cardiovascular:     Rate and Rhythm: Normal rate and regular rhythm.     Heart sounds: Normal heart sounds.  Pulmonary:     Effort: Pulmonary effort is normal. No respiratory distress.     Breath sounds: Normal breath sounds.  Abdominal:     Palpations: Abdomen is soft.     Comments: There is minimal tenderness of the right lower lateral anterior ribs.  Skin:    Findings: No lesion or rash.  Neurological:     General: No focal deficit present.     Mental Status: She is alert and oriented to person, place, and time.  Psychiatric:        Mood and Affect: Mood normal.        Speech: Speech normal.        Behavior: Behavior normal.        Thought Content: Thought content normal.        Judgment: Judgment normal.       No results found for any visits on 12/07/21.  Assessment & Plan      1. Right upper quadrant abdominal pain I think this is musculoskeletal.  Try heating pad and topical capsaicin. Can refer for another opinion via surgery if this continues but patient is not worried about it and I am comfortable this is a benign musculoskeletal process at this time.  No hernia noted on CT scan  2. Malignant neoplasm of upper-outer quadrant of right breast in female, estrogen receptor positive (HCC)   3. Eczema of external ear, bilateral Mometasone cream for a couple of weeks   No follow-ups on file.      I, Wilhemena Durie, MD, have reviewed all documentation for this visit. The documentation on 12/07/21 for the exam, diagnosis, procedures, and orders are all accurate and complete.    Issam Carlyon Cranford Mon, MD  [facility] [phone] (phone) [phone] (fax)  [place]

## 2021-12-19 ENCOUNTER — Encounter: Payer: Self-pay | Admitting: Physician Assistant

## 2021-12-19 ENCOUNTER — Ambulatory Visit (INDEPENDENT_AMBULATORY_CARE_PROVIDER_SITE_OTHER): Payer: BC Managed Care – PPO | Admitting: Physician Assistant

## 2021-12-19 VITALS — BP 109/80 | HR 83 | Temp 98.2°F | Resp 16 | Wt 156.9 lb

## 2021-12-19 DIAGNOSIS — R319 Hematuria, unspecified: Secondary | ICD-10-CM

## 2021-12-19 DIAGNOSIS — R051 Acute cough: Secondary | ICD-10-CM | POA: Diagnosis not present

## 2021-12-19 DIAGNOSIS — J029 Acute pharyngitis, unspecified: Secondary | ICD-10-CM | POA: Diagnosis not present

## 2021-12-19 DIAGNOSIS — H1030 Unspecified acute conjunctivitis, unspecified eye: Secondary | ICD-10-CM

## 2021-12-19 LAB — POCT INFLUENZA A/B
Influenza A, POC: NEGATIVE
Influenza B, POC: NEGATIVE

## 2021-12-19 MED ORDER — AZELASTINE HCL 0.05 % OP SOLN: 1.0000 [drp] | Freq: Two times a day (BID) | OPHTHALMIC | 0 refills | Status: DC

## 2021-12-19 NOTE — Progress Notes (Unsigned)
ct    I,Talma Aguillard Robinson,acting as a Education administrator for [employer], PA-C.,have documented all relevant documentation on the behalf of Mardene Speak, PA-C,as directed by  [employer], PA-C while in the presence of [employer], PA-C.   Established patient visit   Patient: Tonya Guzman   DOB: Aug 17, 1960   61 y.o. Female  MRN: [id number] Visit Date: 12/19/2021  Today's healthcare provider: Mardene Speak, PA-C   Chief Complaint  Patient presents with   Sore Throat   Subjective    Upper respiratory symptoms She complains of achiness, congestion, and cough described as productive/ yellow,  headache, sore throat with no fever, chills, night sweats or weight loss. Onset of symptoms was a few days ago and rapidly worsening.She is drinking plenty of fluids.  Past history is significant for no history of pneumonia or bronchitis. Patient is non-smoker. Taking Mucinex for symptoms.    ---------------------------------------------------------------------------------------------------   Medications: Outpatient Medications Prior to Visit  Medication Sig   ALPRAZolam (XANAX) 0.5 MG tablet TAKE 1 TABLET BY MOUTH AT BEDTIME AS NEEDED   aspirin EC 81 MG tablet Take 81 mg by mouth daily.   letrozole (FEMARA) 2.5 MG tablet Take 2.5 mg by mouth daily.   Magnesium Chloride (MAGNESIUM DR PO) Take by mouth.   mometasone (ELOCON) 0.1 % cream Apply dab to both external areas nightly for 2 weeks then discontinue.  May reuse a month or 2 later as needed   NASONEX 50 MCG/ACT nasal spray Reported on 07/21/2015   No facility-administered medications prior to visit.    Review of Systems  HENT:  Positive for congestion, postnasal drip, sinus pressure, sore throat and trouble swallowing.   Eyes:  Positive for discharge and redness (RIght).  Respiratory:  Positive for cough and chest tightness (2/2 cough). Negative for choking, shortness of breath and wheezing.   Cardiovascular: Negative.   Gastrointestinal:  Negative.   All other systems reviewed and are negative.   {Labs  Heme  Chem  Endocrine  Serology  Results Review (optional):23779}   Objective    BP 109/80 (BP Location: Left Arm, Patient Position: Sitting, Cuff Size: Normal)   Pulse 83   Temp 98.2 F (36.8 C) (Oral)   Resp 16   Wt 156 lb 14.4 oz (71.2 kg)   SpO2 99%   BMI 24.94 kg/m  {Show previous vital signs (optional):23777}  Physical Exam Vitals reviewed.  Constitutional:      General: She is not in acute distress.    Appearance: Normal appearance. She is well-developed. She is not diaphoretic.  HENT:     Head: Normocephalic and atraumatic.     Nose: Congestion present. No rhinorrhea.     Mouth/Throat:     Pharynx: Pharyngeal swelling and posterior oropharyngeal erythema present.  Eyes:     General: No scleral icterus.    Conjunctiva/sclera: Conjunctivae normal.  Neck:     Thyroid: No thyromegaly.  Cardiovascular:     Rate and Rhythm: Normal rate and regular rhythm.     Pulses: Normal pulses.     Heart sounds: Normal heart sounds. No murmur heard. Pulmonary:     Effort: Pulmonary effort is normal. No respiratory distress.     Breath sounds: Normal breath sounds. No wheezing, rhonchi or rales.  Musculoskeletal:     Cervical back: Normal range of motion and neck supple.     Right lower leg: No edema.     Left lower leg: No edema.  Lymphadenopathy:     Cervical: No  cervical adenopathy.  Skin:    General: Skin is warm and dry.     Findings: No rash.  Neurological:     General: No focal deficit present.     Mental Status: She is alert and oriented to person, place, and time. Mental status is at baseline.  Psychiatric:        Mood and Affect: Mood normal.        Behavior: Behavior normal.       No results found for any visits on 12/19/21.  Assessment & Plan     1. Sore throat Could be due to pharyngitis/tonsillitis Has a strong gag reflex - POCT rapid strep A neg - azelastine (OPTIVAR) 0.05 %  ophthalmic solution; Place 1 drop into both eyes 2 (two) times daily.  Dispense: 6 mL; Refill: 0  2. Acute cough Could be due to rhino sinusitis vs URI - POCT Influenza A/B neg - azelastine (OPTIVAR) 0.05 % ophthalmic solution; Place 1 drop into both eyes 2 (two) times daily.  Dispense: 6 mL; Refill: 0  3. Acute conjunctivitis, unspecified acute conjunctivitis type, unspecified laterality Could be 2/2 viral infection - azelastine (OPTIVAR) 0.05 % ophthalmic solution; Place 1 drop into both eyes 2 (two) times daily.  Dispense: 6 mL; Refill: 0   FU PRN The patient was advised to call back or seek an in-person evaluation if the symptoms worsen or if the condition fails to improve as anticipated.  I discussed the assessment and treatment plan with the patient. The patient was provided an opportunity to ask questions and all were answered. The patient agreed with the plan and demonstrated an understanding of the instructions.  The entirety of the information documented in the History of Present Illness, Review of Systems and Physical Exam were personally obtained by me. Portions of this information were initially documented by the CMA and reviewed by me for thoroughness and accuracy.  Portions of this note were created using dictation software and may contain typographical errors.       Mardene Speak, PA-C  [facility] [phone] (phone) [phone] (fax)  [place]

## 2021-12-20 LAB — POCT RAPID STREP A (OFFICE): Rapid Strep A Screen: NEGATIVE

## 2021-12-21 ENCOUNTER — Telehealth: Payer: BC Managed Care – PPO | Admitting: Nurse Practitioner

## 2021-12-21 DIAGNOSIS — J069 Acute upper respiratory infection, unspecified: Secondary | ICD-10-CM

## 2021-12-21 MED ORDER — AMOXICILLIN-POT CLAVULANATE 875-125 MG PO TABS: 1.0000 | ORAL_TABLET | Freq: Two times a day (BID) | ORAL | 0 refills | Status: DC

## 2021-12-21 MED ORDER — PROMETHAZINE-DM 6.25-15 MG/5ML PO SYRP: 5.0000 mL | ORAL_SOLUTION | Freq: Four times a day (QID) | ORAL | 0 refills | Status: DC | PRN

## 2021-12-21 NOTE — Progress Notes (Signed)
Virtual Visit Consent   Tonya Guzman, you are scheduled for a virtual visit with Tonya Guzman, [place], a [place] provider, today.     Just as with appointments in the office, your consent must be obtained to participate.  Your consent will be active for this visit and any virtual visit you may have with one of our providers in the next 365 days.     If you have a MyChart account, a copy of this consent can be sent to you electronically.  All virtual visits are billed to your insurance company just like a traditional visit in the office.    As this is a virtual visit, video technology does not allow for your provider to perform a traditional examination.  This may limit your provider's ability to fully assess your condition.  If your provider identifies any concerns that need to be evaluated in person or the need to arrange testing (such as labs, EKG, etc.), we will make arrangements to do so.     Although advances in technology are sophisticated, we cannot ensure that it will always work on either your end or our end.  If the connection with a video visit is poor, the visit may have to be switched to a telephone visit.  With either a video or telephone visit, we are not always able to ensure that we have a secure connection.     I need to obtain your verbal consent now.   Are you willing to proceed with your visit today? YES   Tonya Guzman has provided verbal consent on 12/21/2021 for a virtual visit (video or telephone).   Tonya Hassell Done, FNP   Date: 12/21/2021 2:29 PM   Virtual Visit via Video Note   I, Tonya Guzman, connected with Tonya Guzman ([id number], November 21, 1960) on 12/21/21 at  2:30 PM EDT by a video-enabled telemedicine application and verified that I am speaking with the correct person using two identifiers.  Location: Patient: Virtual Visit Location Patient: Home Provider: Virtual Visit Location Provider: Mobile   I discussed the limitations of  evaluation and management by telemedicine and the availability of in person appointments. The patient expressed understanding and agreed to proceed.    History of Present Illness: Tonya Guzman is a 61 y.o. who identifies as a female who was assigned female at birth, and is being seen today for uri.  HPI: Patient has been having cold symptoms since last Thursday. She saw physician at her PCP office. Strep, flu and covid were negative. She was prescribed eye drops because her eye was red. She says she is actucally feeling wore today  URI  This is a new problem. The current episode started in the past 7 days. The problem has been gradually worsening. There has been no fever. Associated symptoms include congestion, coughing, headaches and rhinorrhea. She has tried nothing for the symptoms. The treatment provided no relief.    Review of Systems  HENT:  Positive for congestion and rhinorrhea.   Respiratory:  Positive for cough.   Neurological:  Positive for headaches.    Problems:  Patient Active Problem List   Diagnosis Date Noted   Pleuritic chest pain 06/04/2020   Fatigue 06/04/2020   Cough 06/04/2020   Acute midline low back pain without sciatica 06/04/2020   Lab test positive for detection of COVID-19 virus 06/04/2020   Chest pain on breathing 06/04/2020   Genetic testing 04/14/2019   Goals of care, counseling/discussion 03/27/2019  Malignant neoplasm of upper-outer quadrant of right breast in female, estrogen receptor positive ([place]) 03/27/2019   Family history of breast cancer    Menopausal vaginal dryness 04/18/2016   Menopause 04/18/2016    Allergies: No Known Allergies Medications:  Current Outpatient Medications:    ALPRAZolam (XANAX) 0.5 MG tablet, TAKE 1 TABLET BY MOUTH AT BEDTIME AS NEEDED, Disp: 30 tablet, Rfl: 3   aspirin EC 81 MG tablet, Take 81 mg by mouth daily., Disp: , Rfl:    azelastine (OPTIVAR) 0.05 % ophthalmic solution, Place 1 drop into both eyes 2 (two)  times daily., Disp: 6 mL, Rfl: 0   dextromethorphan-guaiFENesin (MUCINEX DM) 30-600 MG 12hr tablet, Take 1 tablet by mouth 2 (two) times daily., Disp: , Rfl:    letrozole (FEMARA) 2.5 MG tablet, Take 2.5 mg by mouth daily., Disp: , Rfl:    Magnesium Chloride (MAGNESIUM DR PO), Take by mouth., Disp: , Rfl:    mometasone (ELOCON) 0.1 % cream, Apply dab to both external areas nightly for 2 weeks then discontinue.  May reuse a month or 2 later as needed, Disp: 15 g, Rfl: 1   NASONEX 50 MCG/ACT nasal spray, Reported on 07/21/2015, Disp: , Rfl:   Observations/Objective: Patient is well-developed, well-nourished in no acute distress.  Resting comfortably  at home.  Head is normocephalic, atraumatic.  No labored breathing.  Speech is clear and coherent with logical content.  Patient is alert and oriented at baseline.  Nose is red Wet cough Raspy voice  Assessment and Plan:  Tonya Guzman in today with chief complaint of URI   1. URI with cough and congestion 1. Take meds as prescribed 2. Use a cool mist humidifier especially during the winter months and when heat has been humid. 3. Use saline nose sprays frequently 4. Saline irrigations of the nose can be very helpful if Guzman frequently.  * 4X daily for 1 week*  * Use of a nettie pot can be helpful with this. Follow directions with this* 5. Drink plenty of fluids 6. Keep thermostat turn down low 7.For any cough or congestion- promethazine dm 8. For fever or aces or pains- take tylenol or ibuprofen appropriate for age and weight.  * for fevers greater than 101 orally you may alternate ibuprofen and tylenol every  3 hours.   Meds ordered this encounter  Medications   amoxicillin-clavulanate (AUGMENTIN) 875-125 MG tablet    Sig: Take 1 tablet by mouth 2 (two) times daily.    Dispense:  14 tablet    Refill:  0    Order Specific Question:   Supervising Provider    Answer:   Noemi Chapel [[id number]]   promethazine-dextromethorphan  (PROMETHAZINE-DM) 6.25-15 MG/5ML syrup    Sig: Take 5 mLs by mouth 4 (four) times daily as needed for cough.    Dispense:  118 mL    Refill:  0    Order Specific Question:   Supervising Provider    Answer:   Noemi Chapel [[id number]]       Follow Up Instructions: I discussed the assessment and treatment plan with the patient. The patient was provided an opportunity to ask questions and all were answered. The patient agreed with the plan and demonstrated an understanding of the instructions.  A copy of instructions were sent to the patient via MyChart.  The patient was advised to call back or seek an in-person evaluation if the symptoms worsen or if the condition fails to improve as anticipated.  Time:  I spent 8  minutes with the patient via telehealth technology discussing the above problems/concerns.    Tonya Hassell Done, FNP

## 2021-12-21 NOTE — Patient Instructions (Signed)
1. Take meds as prescribed 2. Use a cool mist humidifier especially during the winter months and when heat has been humid. 3. Use saline nose sprays frequently 4. Saline irrigations of the nose can be very helpful if done frequently.  * 4X daily for 1 week*  * Use of a nettie pot can be helpful with this. Follow directions with this* 5. Drink plenty of fluids 6. Keep thermostat turn down low 7.For any cough or congestion- promethazine DM as prescribed 8. For fever or aces or pains- take tylenol or ibuprofen appropriate for age and weight.  * for fevers greater than 101 orally you may alternate ibuprofen and tylenol every  3 hours.

## 2022-01-18 ENCOUNTER — Encounter: Payer: Self-pay | Admitting: Family Medicine

## 2022-01-18 ENCOUNTER — Ambulatory Visit (INDEPENDENT_AMBULATORY_CARE_PROVIDER_SITE_OTHER): Payer: BC Managed Care – PPO | Admitting: Family Medicine

## 2022-01-18 VITALS — BP 105/72 | HR 77 | Resp 16 | Ht 66.5 in | Wt 157.0 lb

## 2022-01-18 DIAGNOSIS — Z0000 Encounter for general adult medical examination without abnormal findings: Secondary | ICD-10-CM | POA: Diagnosis not present

## 2022-01-18 DIAGNOSIS — M545 Low back pain, unspecified: Secondary | ICD-10-CM

## 2022-01-18 DIAGNOSIS — F419 Anxiety disorder, unspecified: Secondary | ICD-10-CM | POA: Diagnosis not present

## 2022-01-18 DIAGNOSIS — C50411 Malignant neoplasm of upper-outer quadrant of right female breast: Secondary | ICD-10-CM

## 2022-01-18 DIAGNOSIS — E7800 Pure hypercholesterolemia, unspecified: Secondary | ICD-10-CM

## 2022-01-18 DIAGNOSIS — R7309 Other abnormal glucose: Secondary | ICD-10-CM | POA: Diagnosis not present

## 2022-01-18 DIAGNOSIS — Z170 Estrogen receptor positive status [ER+]: Secondary | ICD-10-CM

## 2022-01-18 DIAGNOSIS — Z13220 Encounter for screening for lipoid disorders: Secondary | ICD-10-CM

## 2022-01-18 NOTE — Progress Notes (Signed)
Complete physical exam  I,April Miller,acting as a scribe for Wilhemena Durie, MD.,have documented all relevant documentation on the behalf of Wilhemena Durie, MD,as directed by  Wilhemena Durie, MD while in the presence of Wilhemena Durie, MD.   Patient: Tonya Guzman   DOB: 1960/09/25   61 y.o. Female  MRN: [id number] Visit Date: 01/18/2022  Today's healthcare provider: Wilhemena Durie, MD   Chief Complaint  Patient presents with   Annual Exam   Subjective    Tonya Guzman is a 61 y.o. female who presents today for a complete physical exam.  Patient doing well.  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   Family history of breast cancer    Fatigue    Insomnia    Night sweats    Past Surgical History:  Procedure Laterality Date   CESAREAN SECTION     COLONOSCOPY  2010   repeat due after 10 yrs   FOOT SURGERY Right    KIDNEY SURGERY     Social History   Socioeconomic History   Marital status: Married    Spouse name: Not on file   Number of children: Not on file   Years of education: Not on file   Highest education level: Not on file  Occupational History   Not on file  Tobacco Use   Smoking status: Former    Packs/day: 0.25    Years: 1.00    Total pack years: 0.25    Types: Cigarettes    Quit date: 05/08/2012    Years since quitting: 9.7   Smokeless tobacco: Never  Vaping Use   Vaping Use: Never used  Substance and Sexual Activity   Alcohol use: Yes    Comment: 3 drinks a week   Drug use: No   Sexual activity: Yes    Birth control/protection: Post-menopausal  Other Topics Concern   Not on file  Social History Narrative   Not on file   Social Determinants of Health   Financial Resource Strain: Not on file  Food Insecurity: Not on file  Transportation Needs: Not on file  Physical Activity: Insufficiently Active (08/16/2017)   Exercise Vital Sign    Days of Exercise per  Week: 2 days    Minutes of Exercise per Session: 30 min  Stress: Not on file  Social Connections: Not on file  Intimate Partner Violence: Not on file   Family Status  Relation Name Status   Mother  Alive   Father  Deceased at age 99   Sister  17   Brother  Alive   Sister  Alive   Brother  Deceased   [place]  Deceased   [place]  Deceased   [place] Uncle  Deceased   MGM  Deceased   MGF  Deceased   [place]  Deceased   PGF  Deceased   Mat Uncle  Deceased   Mat Uncle x3 Deceased   Son  Alive   Daughter  Alive   Neg Hx  (Not Specified)   Family History  Problem Relation Age of Onset   Pneumonia Mother    Hypertension Mother    Anemia Mother    Hyperlipidemia Father    Hypertension Father    Heart disease Father    Heart attack Father    Cancer Father        vocal  Hypertension Sister    Breast cancer Sister        right breast   Hypertension Sister    Hepatitis C Brother    Breast cancer Maternal Aunt 16   Cancer Maternal Uncle        unk type   Cancer Maternal Uncle        unk type   Ovarian cancer Neg Hx    Colon cancer Neg Hx    Diabetes Neg Hx    No Known Allergies  Patient Care Team: Jerrol Banana., MD as PCP - General (Family Medicine)   Medications: Outpatient Medications Prior to Visit  Medication Sig   ALPRAZolam (XANAX) 0.5 MG tablet TAKE 1 TABLET BY MOUTH AT BEDTIME AS NEEDED   aspirin EC 81 MG tablet Take 81 mg by mouth daily.   azelastine (OPTIVAR) 0.05 % ophthalmic solution Place 1 drop into both eyes 2 (two) times daily.   letrozole (FEMARA) 2.5 MG tablet Take 2.5 mg by mouth daily.   Magnesium Chloride (MAGNESIUM DR PO) Take by mouth.   mometasone (ELOCON) 0.1 % cream Apply dab to both external areas nightly for 2 weeks then discontinue.  May reuse a month or 2 later as needed   NASONEX 50 MCG/ACT nasal spray Reported on 07/21/2015   promethazine-dextromethorphan (PROMETHAZINE-DM) 6.25-15 MG/5ML syrup Take 5 mLs by mouth 4 (four) times  daily as needed for cough.   [DISCONTINUED] amoxicillin-clavulanate (AUGMENTIN) 875-125 MG tablet Take 1 tablet by mouth 2 (two) times daily. (Patient not taking: Reported on 01/18/2022)   [DISCONTINUED] dextromethorphan-guaiFENesin (MUCINEX DM) 30-600 MG 12hr tablet Take 1 tablet by mouth 2 (two) times daily. (Patient not taking: Reported on 01/18/2022)   No facility-administered medications prior to visit.    Review of Systems  All other systems reviewed and are negative.   Last metabolic panel Lab Results  Component Value Date   GLUCOSE 108 (H) 01/19/2022   NA 141 01/19/2022   K 4.5 01/19/2022   CL 104 01/19/2022   CO2 20 01/19/2022   BUN 24 01/19/2022   CREATININE 0.91 01/19/2022   EGFR 71 01/19/2022   CALCIUM 9.8 01/19/2022   PROT 7.2 01/19/2022   ALBUMIN 4.8 01/19/2022   LABGLOB 2.4 01/19/2022   AGRATIO 2.0 01/19/2022   BILITOT 0.5 01/19/2022   ALKPHOS 59 01/19/2022   AST 21 01/19/2022   ALT 23 01/19/2022      Objective     BP 105/72 (BP Location: Right Arm, Patient Position: Sitting, Cuff Size: Normal)   Pulse 77   Resp 16   Ht 5' 6.5" (1.689 m)   Wt 157 lb (71.2 kg)   SpO2 97%   BMI 24.96 kg/m  BP Readings from Last 3 Encounters:  01/18/22 105/72  12/19/21 109/80  12/07/21 102/80   Wt Readings from Last 3 Encounters:  01/18/22 157 lb (71.2 kg)  12/19/21 156 lb 14.4 oz (71.2 kg)  12/07/21 159 lb (72.1 kg)       Physical Exam Vitals reviewed.  Constitutional:      Appearance: She is well-developed.  HENT:     Head: Normocephalic and atraumatic.     Right Ear: External ear normal.     Left Ear: External ear normal.     Nose: Nose normal.  Eyes:     General: No scleral icterus.    Conjunctiva/sclera: Conjunctivae normal.  Neck:     Thyroid: No thyromegaly.  Cardiovascular:     Rate and Rhythm: Normal rate  and regular rhythm.     Heart sounds: Normal heart sounds.  Pulmonary:     Effort: Pulmonary effort is normal.     Breath sounds: Normal  breath sounds.  Abdominal:     Palpations: Abdomen is soft.  Skin:    General: Skin is warm and dry.  Neurological:     General: No focal deficit present.     Mental Status: She is alert and oriented to person, place, and time.  Psychiatric:        Mood and Affect: Mood normal.        Behavior: Behavior normal.        Thought Content: Thought content normal.        Judgment: Judgment normal.       Last depression screening scores    01/18/2022    1:52 PM 01/11/2021    4:05 PM 02/05/2020    9:49 AM  PHQ 2/9 Scores  PHQ - 2 Score 0 0 0  PHQ- 9 Score 0  0   Last fall risk screening    01/18/2022    1:52 PM  [place] in the past year? 0  Number falls in past yr: 0  Injury with Fall? 0  Risk for fall due to : No Fall Risks  Follow up Falls evaluation completed   Last Audit-C alcohol use screening    01/18/2022    1:52 PM  Alcohol Use Disorder Test (AUDIT)  1. How often do you have a drink containing alcohol? 2  2. How many drinks containing alcohol do you have on a typical day when you are drinking? 0  3. How often do you have six or more drinks on one occasion? 0  AUDIT-C Score 2   A score of 3 or more in women, and 4 or more in men indicates increased risk for alcohol abuse, EXCEPT if all of the points are from question 1   No results found for any visits on 01/18/22.  Assessment & Plan    Routine Health Maintenance and Physical Exam  Exercise Activities and Dietary recommendations  Goals   None     Immunization History  Administered Date(s) Administered   Td 01/31/2018   Tdap 03/13/2007    Health Maintenance  Topic Date Due   COVID-19 Vaccine (1) Never done   HIV Screening  Never done   Zoster Vaccines- Shingrix (1 of 2) Never done   COLONOSCOPY (Pts 45-57yr Insurance coverage will need to be confirmed)  01/04/2021   MAMMOGRAM  03/04/2021   INFLUENZA VACCINE  Never done   PAP SMEAR-Modifier  01/29/2023   TETANUS/TDAP  02/01/2028    Hepatitis C Screening  Completed   HPV VACCINES  Aged Out    Discussed health benefits of physical activity, and encouraged her to engage in regular exercise appropriate for her age and condition.  1. [facility] exam [facility] and colonoscopy and Pap   - Lipid panel - CBC w/Diff/Platelet - Comprehensive Metabolic Panel (CMET) - TSH - Hemoglobin A1c  2. Elevated LDL cholesterol level  - Lipid panel - CBC w/Diff/Platelet - Comprehensive Metabolic Panel (CMET) - TSH - Hemoglobin A1c  3. Elevated glucose level  - Lipid panel - CBC w/Diff/Platelet - Comprehensive Metabolic Panel (CMET) - TSH - Hemoglobin A1c  4. Anxiety Controlled - Lipid panel - CBC w/Diff/Platelet - Comprehensive Metabolic Panel (CMET) - TSH - Hemoglobin A1c  5. Screening for lipid  disorders  - Lipid panel - CBC w/Diff/Platelet - Comprehensive Metabolic Panel (CMET) - TSH - Hemoglobin A1c 6.  Breast cancer On Femara  No follow-ups on file.     I, Wilhemena Durie, MD, have reviewed all documentation for this visit. The documentation on 01/21/22 for the exam, diagnosis, procedures, and orders are all accurate and complete.    Davin Muramoto Cranford Mon, MD  [facility] [phone] (phone) [phone] (fax)  [place]

## 2022-01-20 LAB — LIPID PANEL
Chol/HDL Ratio: 2.9 ratio (ref 0.0–4.4)
Cholesterol, Total: 282 mg/dL — ABNORMAL HIGH (ref 100–199)
HDL: 96 mg/dL (ref >39)
LDL Chol Calc (NIH): 173 mg/dL — ABNORMAL HIGH (ref 0–99)
Triglycerides: 79 mg/dL (ref 0–149)
VLDL Cholesterol Cal: 13 mg/dL (ref 5–40)

## 2022-01-20 LAB — COMPREHENSIVE METABOLIC PANEL
ALT: 23 IU/L (ref 0–32)
AST: 21 IU/L (ref 0–40)
Albumin/Globulin Ratio: 2 (ref 1.2–2.2)
Albumin: 4.8 g/dL (ref 3.9–4.9)
Alkaline Phosphatase: 59 IU/L (ref 44–121)
BUN/Creatinine Ratio: 26 (ref 12–28)
BUN: 24 mg/dL (ref 8–27)
Bilirubin Total: 0.5 mg/dL (ref 0.0–1.2)
CO2: 20 mmol/L (ref 20–29)
Calcium: 9.8 mg/dL (ref 8.7–10.3)
Chloride: 104 mmol/L (ref 96–106)
Creatinine, Ser: 0.91 mg/dL (ref 0.57–1.00)
Globulin, Total: 2.4 g/dL (ref 1.5–4.5)
Glucose: 108 mg/dL — ABNORMAL HIGH (ref 70–99)
Potassium: 4.5 mmol/L (ref 3.5–5.2)
Sodium: 141 mmol/L (ref 134–144)
Total Protein: 7.2 g/dL (ref 6.0–8.5)
eGFR: 71 mL/min/{1.73_m2} (ref >59)

## 2022-01-20 LAB — CBC WITH DIFFERENTIAL/PLATELET
Basophils Absolute: 0.1 10*3/uL (ref 0.0–0.2)
Basos: 2 %
EOS (ABSOLUTE): 0.2 10*3/uL (ref 0.0–0.4)
Eos: 5 %
Hematocrit: 38 % (ref 34.0–46.6)
Hemoglobin: 12.7 g/dL (ref 11.1–15.9)
Immature Grans (Abs): 0 10*3/uL (ref 0.0–0.1)
Immature Granulocytes: 0 %
Lymphocytes Absolute: 1.3 10*3/uL (ref 0.7–3.1)
Lymphs: 28 %
MCH: 31.5 pg (ref 26.6–33.0)
MCHC: 33.4 g/dL (ref 31.5–35.7)
MCV: 94 fL (ref 79–97)
Monocytes Absolute: 0.5 10*3/uL (ref 0.1–0.9)
Monocytes: 10 %
Neutrophils Absolute: 2.5 10*3/uL (ref 1.4–7.0)
Neutrophils: 55 %
Platelets: 240 10*3/uL (ref 150–450)
RBC: 4.03 x10E6/uL (ref 3.77–5.28)
RDW: 13.5 % (ref 11.7–15.4)
WBC: 4.6 10*3/uL (ref 3.4–10.8)

## 2022-01-20 LAB — HEMOGLOBIN A1C
Est. average glucose Bld gHb Est-mCnc: 120 mg/dL
Hgb A1c MFr Bld: 5.8 % — ABNORMAL HIGH (ref 4.8–5.6)

## 2022-01-20 LAB — TSH: TSH: 2.57 u[IU]/mL (ref 0.450–4.500)

## 2022-04-03 ENCOUNTER — Other Ambulatory Visit: Payer: Self-pay | Admitting: Family Medicine

## 2022-04-03 DIAGNOSIS — F419 Anxiety disorder, unspecified: Secondary | ICD-10-CM

## 2022-04-03 MED ORDER — ALPRAZOLAM 0.5 MG PO TABS: 0.5000 mg | ORAL_TABLET | Freq: Every evening | ORAL | 0 refills | Status: DC | PRN

## 2022-04-03 NOTE — Telephone Encounter (Signed)
[facility] faxed refill request for the following medications:   ALPRAZolam (XANAX) 0.5 MG tablet     Please advise

## 2022-05-31 ENCOUNTER — Other Ambulatory Visit: Payer: Self-pay | Admitting: Family Medicine

## 2022-05-31 DIAGNOSIS — F419 Anxiety disorder, unspecified: Secondary | ICD-10-CM

## 2022-05-31 NOTE — Telephone Encounter (Signed)
Please review.  KP

## 2022-06-02 NOTE — Telephone Encounter (Signed)
Pt called for update on the refill request. Pt requests call back to advise.

## 2022-10-18 NOTE — Progress Notes (Signed)
PCP: Bosie Clos, MD   Chief Complaint  Patient presents with   Gynecologic Exam    Tenderness and possible lump in RB x 2 months    HPI:      [name] is a 62 y.o. [id number] who LMP was No LMP recorded. Patient is postmenopausal., presents today for her annual examination.  Her menses are absent due to menopause (2015). No pelvic pain or PMB. Tolerable vasomotor sx.    Sex activity--very limited due to dyspareunia: single partner, contraception - post menopausal status. She does have vaginal dryness. She used estrace crm in the past and liked that better than intrarosa, but can't use either anymore due to breast cancer dx last yr. Has tried coconut oil and hyaluronic acid supp (burned).   Last Pap: 01/29/20 Results were: no abnormalities /neg HPV DNA.   Last mammogram: 03/02/22  Results were normal, repeat in 12 months.  03/14/19  Results were: cat 4 RT breast. 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 Being followed by onc. Noticed RT breast mass at incision site about 2 months ago on SBE. Area is tender. Wasn't there before.   There is a FH of breast cancer in her mat aunt and sister, pt is gene panel neg. There is no FH of ovarian cancer. The patient does do self-breast exams.  Colonoscopy: 3/23 at [facility]; repeat due after 10 years   Tobacco use: The patient denies current or previous tobacco use. Alcohol use: social drinker  No drug use. Exercise: moderately active  DEXA 2/21 at [facility] with oncology: osteopenia  She does get adequate calcium but not Vitamin D in her diet. Can't take Vit D or calcium supp due to constipation. Already takes metamucil daily.  Labs with PCP.   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   Family history of breast cancer    Fatigue    Insomnia     Night sweats     Past Surgical History:  Procedure Laterality Date   CESAREAN SECTION     COLONOSCOPY  2010   repeat due after 10 yrs   FOOT SURGERY Right    KIDNEY SURGERY      Family History  Problem Relation Age of Onset   Pneumonia Mother    Hypertension Mother    Anemia Mother    Hyperlipidemia Father    Hypertension Father    Heart disease Father    Heart attack Father    Cancer Father        vocal   Hypertension Sister    Breast cancer Sister        right breast   Hypertension Sister    Hepatitis C Brother    Breast cancer Maternal Aunt 48   Cancer Maternal Uncle        unk type   Cancer Maternal Uncle        unk type   Ovarian cancer Neg Hx    Colon cancer Neg Hx    Diabetes Neg Hx     Social History   Socioeconomic History   Marital status: Married    Spouse name: Not on file   Number of children: Not on file   Years of education: Not on file   Highest education level: Not on  file  Occupational History   Not on file  Tobacco Use   Smoking status: Former    Packs/day: 0.25    Years: 1.00    Additional pack years: 0.00    Total pack years: 0.25    Types: Cigarettes    Quit date: 05/08/2012    Years since quitting: 10.4   Smokeless tobacco: Never  Vaping Use   Vaping Use: Never used  Substance and Sexual Activity   Alcohol use: Yes    Comment: 3 drinks a week   Drug use: No   Sexual activity: Yes    Birth control/protection: Post-menopausal  Other Topics Concern   Not on file  Social History Narrative   Not on file   Social Determinants of Health   Financial Resource Strain: Not on file  Food Insecurity: Not on file  Transportation Needs: Not on file  Physical Activity: Insufficiently Active (08/16/2017)   Exercise Vital Sign    Days of Exercise per Week: 2 days    Minutes of Exercise per Session: 30 min  Stress: Not on file  Social Connections: Not on file  Intimate Partner Violence: Not on file     Current Outpatient  Medications:    ALPRAZolam (XANAX) 0.5 MG tablet, Take 1 tablet (0.5 mg total) by mouth at bedtime as needed. Please schedule office visit before any future refill., Disp: 30 tablet, Rfl: 0   aspirin EC 81 MG tablet, Take 81 mg by mouth daily., Disp: , Rfl:    Magnesium Chloride (MAGNESIUM DR PO), Take by mouth., Disp: , Rfl:    NASONEX 50 MCG/ACT nasal spray, Reported on 07/21/2015, Disp: , Rfl:    triamcinolone cream (KENALOG) 0.5 %, Apply topically., Disp: , Rfl:      ROS:  Review of Systems  Constitutional:  Negative for fatigue, fever and unexpected weight change.  Respiratory:  Negative for cough, shortness of breath and wheezing.   Cardiovascular:  Negative for chest pain, palpitations and leg swelling.  Gastrointestinal:  Negative for blood in stool, constipation, diarrhea, nausea and vomiting.  Endocrine: Negative for cold intolerance, heat intolerance and polyuria.  Genitourinary:  Positive for dyspareunia. Negative for dysuria, flank pain, frequency, genital sores, hematuria, menstrual problem, pelvic pain, urgency, vaginal bleeding, vaginal discharge and vaginal pain.  Musculoskeletal:  Negative for back pain, joint swelling and myalgias.  Skin:  Negative for rash.  Neurological:  Negative for dizziness, syncope, light-headedness, numbness and headaches.  Hematological:  Negative for adenopathy.  Psychiatric/Behavioral:  Negative for agitation, confusion, sleep disturbance and suicidal ideas. The patient is not nervous/anxious.    BREAST: mass/tenderness    Objective: BP 112/80   Ht 5\' 7"  (1.702 m)   Wt 160 lb (72.6 kg)   BMI 25.06 kg/m    Physical Exam Constitutional:      Appearance: She is well-developed.  Genitourinary:     Vulva normal.     Right Labia: No rash, tenderness or lesions.    Left Labia: No tenderness, lesions or rash.    No vaginal discharge, erythema or tenderness.     Moderate vaginal atrophy present.     Right Adnexa: not tender and no  mass present.    Left Adnexa: not tender and no mass present.    No cervical friability or polyp.     Uterus is not enlarged or tender.  Breasts:    Right: Mass present. No nipple discharge, skin change or tenderness.     Left: No mass, nipple discharge,  skin change or tenderness.  Neck:     Thyroid: No thyromegaly.  Cardiovascular:     Rate and Rhythm: Normal rate and regular rhythm.     Heart sounds: Normal heart sounds. No murmur heard. Pulmonary:     Effort: Pulmonary effort is normal.     Breath sounds: Normal breath sounds.  Chest:    Abdominal:     Palpations: Abdomen is soft.     Tenderness: There is no abdominal tenderness. There is no guarding or rebound.  Musculoskeletal:        General: Normal range of motion.     Cervical back: Normal range of motion.  Lymphadenopathy:     Cervical: No cervical adenopathy.  Neurological:     General: No focal deficit present.     Mental Status: She is alert and oriented to person, place, and time.     Cranial Nerves: No cranial nerve deficit.  Skin:    General: Skin is warm and dry.  Psychiatric:        Mood and Affect: Mood normal.        Behavior: Behavior normal.        Thought Content: Thought content normal.        Judgment: Judgment normal.  Vitals reviewed.    Assessment/Plan: Encounter for annual routine gynecological examination  Malignant neoplasm of right breast in female, estrogen receptor positive, unspecified site of breast (HCC) - Plan: [place] LIMITED ULTRASOUND INCLUDING AXILLA RIGHT BREAST, MM 3D DIAGNOSTIC MAMMOGRAM UNILATERAL RIGHT BREAST  Mass of upper inner quadrant of right breast - Plan: [place] LIMITED ULTRASOUND INCLUDING AXILLA RIGHT BREAST, MM 3D DIAGNOSTIC MAMMOGRAM UNILATERAL RIGHT BREAST; RT breast mass on exam; pt to schedule mammo/u/s. Will f/u with results.   Vaginal dryness, menopausal--try coconut oil inside vaginally.   Dyspareunia in female--can't have vag ERT, can't tolerate hyaluronic acid  supp. Try coconut oil.         GYN counsel breast self exam, mammography screening, menopause, adequate intake of calcium and vitamin D, diet and exercise    F/U  Return in about 1 year (around 10/19/2023).  Tonya Gambill B. Karn Derk, PA-C 10/19/2022 5:12 PM

## 2022-10-19 ENCOUNTER — Encounter: Payer: Self-pay | Admitting: Obstetrics and Gynecology

## 2022-10-19 ENCOUNTER — Ambulatory Visit (INDEPENDENT_AMBULATORY_CARE_PROVIDER_SITE_OTHER): Payer: BC Managed Care – PPO | Admitting: Obstetrics and Gynecology

## 2022-10-19 VITALS — BP 112/80 | Ht 67.0 in | Wt 160.0 lb

## 2022-10-19 DIAGNOSIS — N6312 Unspecified lump in the right breast, upper inner quadrant: Secondary | ICD-10-CM

## 2022-10-19 DIAGNOSIS — N941 Unspecified dyspareunia: Secondary | ICD-10-CM

## 2022-10-19 DIAGNOSIS — Z79811 Long term (current) use of aromatase inhibitors: Secondary | ICD-10-CM

## 2022-10-19 DIAGNOSIS — Z1211 Encounter for screening for malignant neoplasm of colon: Secondary | ICD-10-CM

## 2022-10-19 DIAGNOSIS — Z01419 Encounter for gynecological examination (general) (routine) without abnormal findings: Secondary | ICD-10-CM | POA: Diagnosis not present

## 2022-10-19 DIAGNOSIS — C50911 Malignant neoplasm of unspecified site of right female breast: Secondary | ICD-10-CM

## 2022-10-19 DIAGNOSIS — N951 Menopausal and female climacteric states: Secondary | ICD-10-CM

## 2022-10-19 NOTE — Patient Instructions (Signed)
I value your feedback and you entrusting us with your care. If you get a [facility] patient survey, I would appreciate you taking the time to let us know about your experience today. Thank you! ? ? ?

## 2022-10-20 ENCOUNTER — Inpatient Hospital Stay
Admission: RE | Admit: 2022-10-20 | Discharge: 2022-10-20 | Disposition: A | Discharge status: Home / Self Care | Payer: Self-pay | Source: Ambulatory Visit | Attending: Family Medicine | Admitting: Family Medicine

## 2022-10-20 ENCOUNTER — Other Ambulatory Visit: Payer: Self-pay | Admitting: *Deleted

## 2022-10-20 DIAGNOSIS — Z1231 Encounter for screening mammogram for malignant neoplasm of breast: Secondary | ICD-10-CM

## 2022-10-25 ENCOUNTER — Ambulatory Visit
Admission: RE | Admit: 2022-10-25 | Discharge: 2022-10-25 | Disposition: A | Discharge status: Home / Self Care | Payer: BC Managed Care – PPO | Source: Ambulatory Visit | Attending: Obstetrics and Gynecology | Admitting: Obstetrics and Gynecology

## 2022-10-25 DIAGNOSIS — C50911 Malignant neoplasm of unspecified site of right female breast: Secondary | ICD-10-CM

## 2022-10-25 DIAGNOSIS — Z170 Estrogen receptor positive status [ER+]: Secondary | ICD-10-CM | POA: Diagnosis present

## 2022-10-25 DIAGNOSIS — N6312 Unspecified lump in the right breast, upper inner quadrant: Secondary | ICD-10-CM

## 2023-01-05 IMAGING — CR DG CHEST 2V
1 series · 2 of 2 positions shown · non-contrast
Comparison: None.

CLINICAL DATA: Chest pain with inspiration

EXAM:
CHEST - 2 VIEW

[Series 1: dg chest 2 view · 0.14mm/px · 2 of 2 slices shown]
[im 1/2]
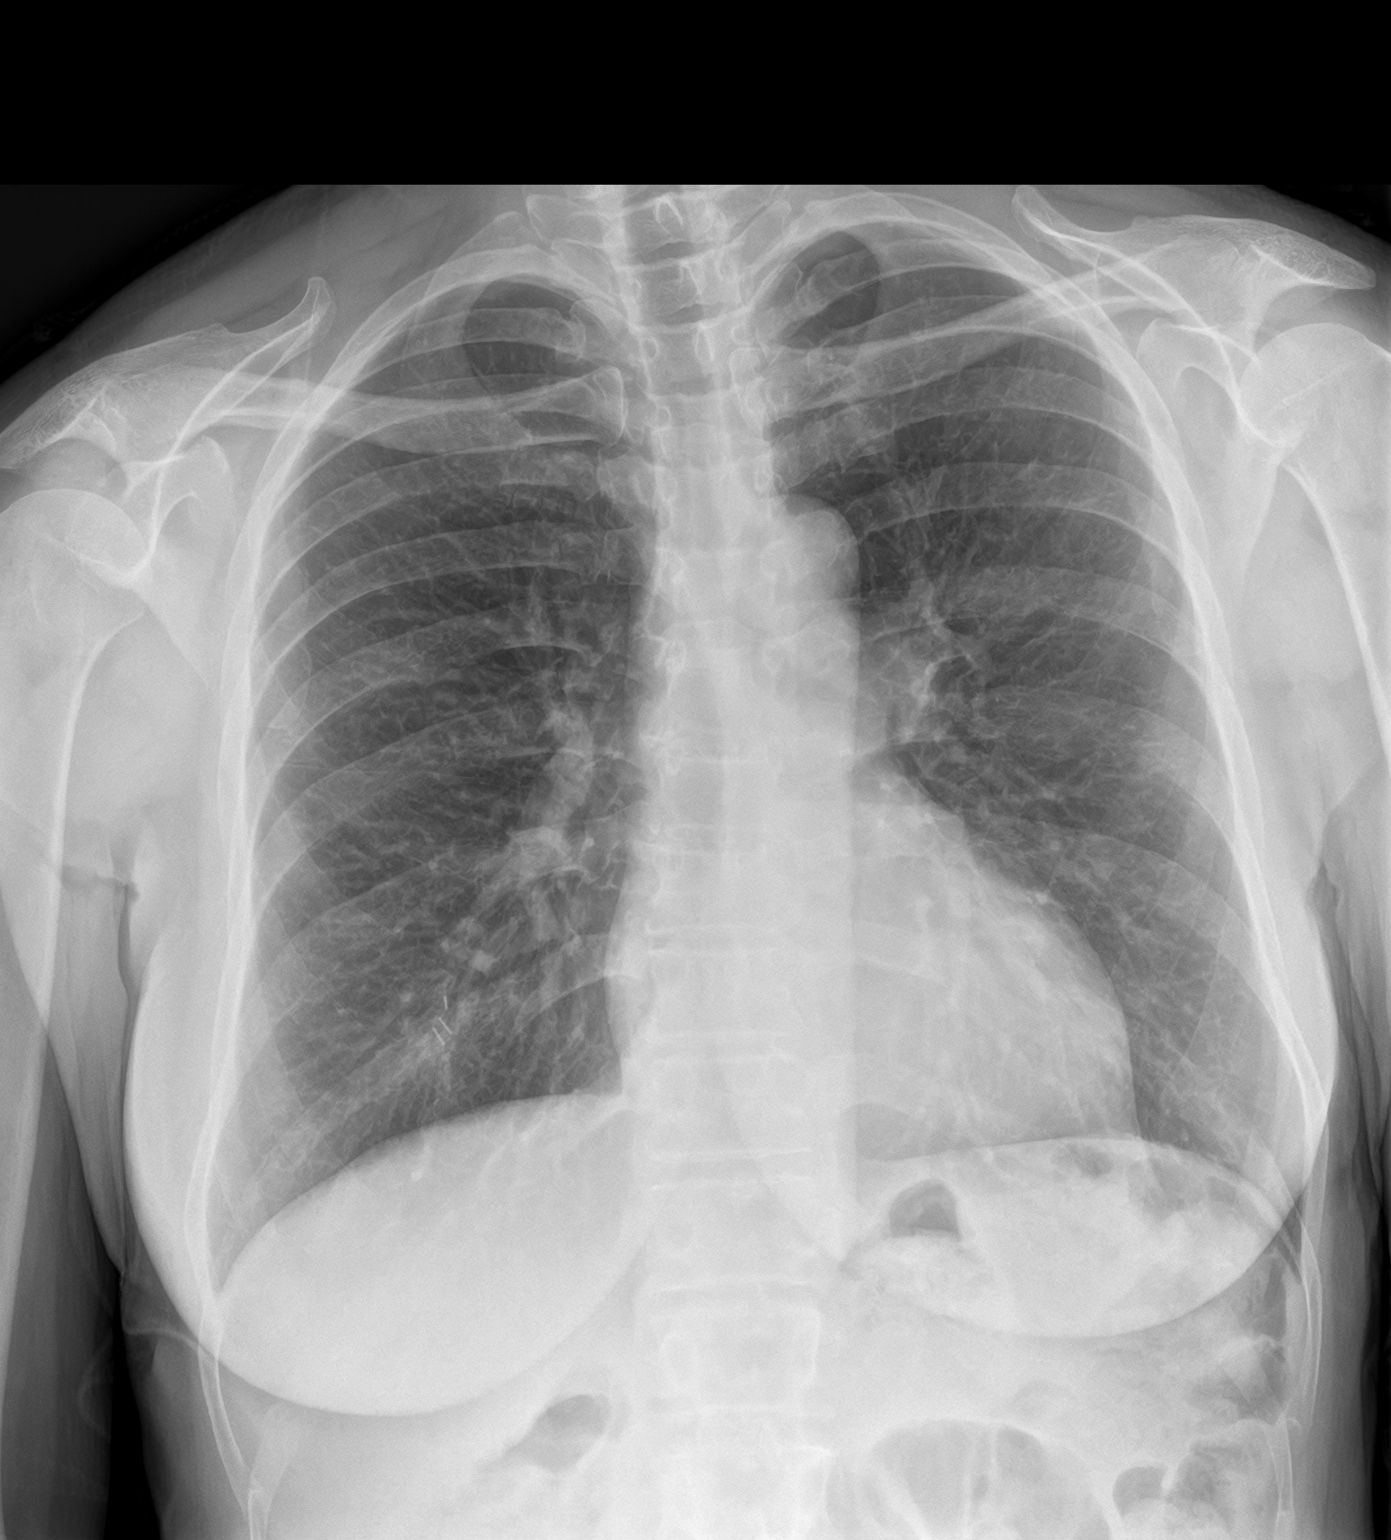
[im 2/2]
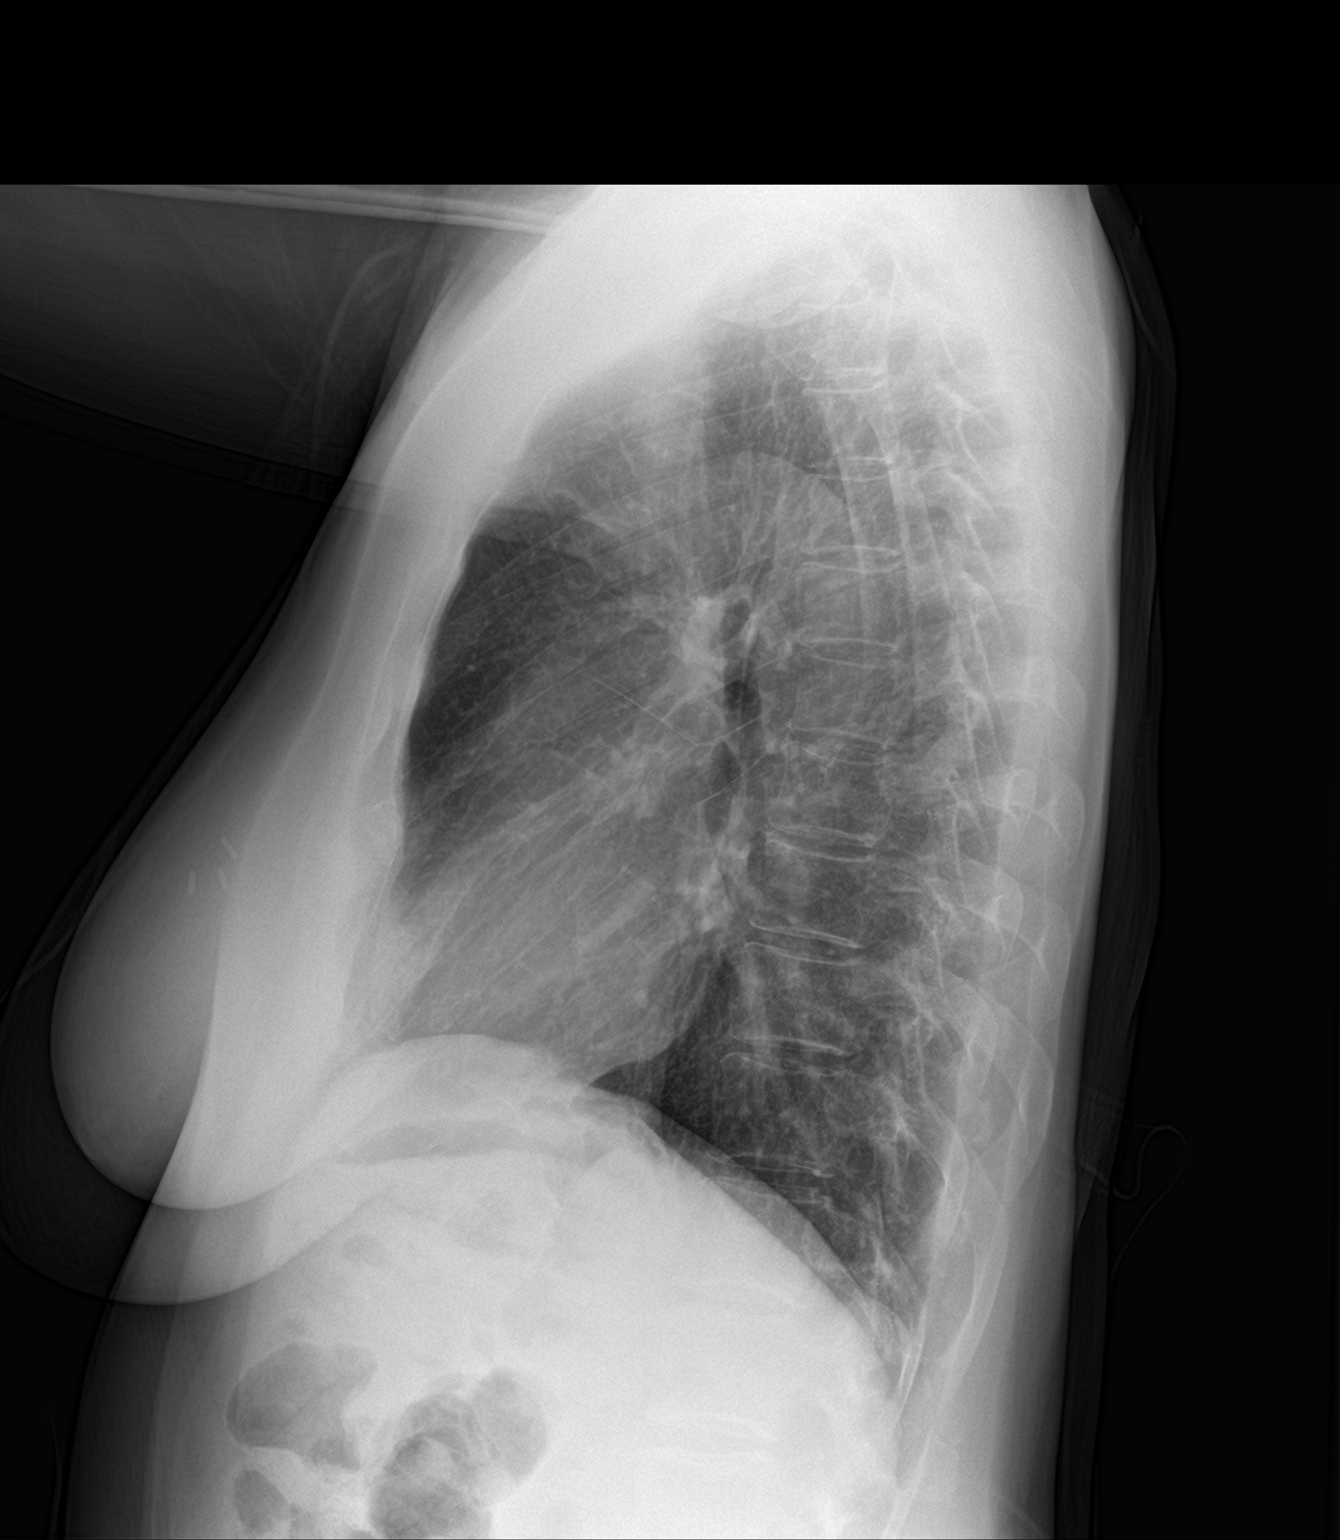

[2 of 2 positions shown; findings below may reference images not displayed]

FINDINGS: The heart size and mediastinal contours are within normal limits.
Both lungs are clear. The visualized skeletal structures are
unremarkable.
IMPRESSION: No acute abnormality of the lungs. No radiographic findings to
explain pain.

## 2023-03-28 DIAGNOSIS — R051 Acute cough: Secondary | ICD-10-CM | POA: Diagnosis not present

## 2023-03-28 DIAGNOSIS — J189 Pneumonia, unspecified organism: Secondary | ICD-10-CM | POA: Diagnosis not present

## 2024-01-23 ENCOUNTER — Ambulatory Visit

## 2024-01-24 ENCOUNTER — Ambulatory Visit (INDEPENDENT_AMBULATORY_CARE_PROVIDER_SITE_OTHER)

## 2024-01-24 VITALS — BP 109/84 | HR 77 | Temp 98.8°F | Resp 16 | Ht 67.0 in | Wt 154.3 lb

## 2024-01-24 DIAGNOSIS — R350 Frequency of micturition: Secondary | ICD-10-CM | POA: Diagnosis not present

## 2024-01-24 DIAGNOSIS — R300 Dysuria: Secondary | ICD-10-CM

## 2024-01-24 LAB — POCT URINALYSIS DIPSTICK
Bilirubin, UA: NEGATIVE
Blood, UA: NEGATIVE
Glucose, UA: NEGATIVE
Ketones, UA: NEGATIVE
Leukocytes, UA: NEGATIVE
Nitrite, UA: NEGATIVE
Protein, UA: NEGATIVE
Spec Grav, UA: 1.01 (ref 1.010–1.025)
Urobilinogen, UA: 0.2 U/dL
pH, UA: 5 (ref 5.0–8.0)

## 2024-01-24 NOTE — Patient Instructions (Signed)

## 2024-01-24 NOTE — Progress Notes (Signed)
    NURSE VISIT NOTE  Subjective:    Patient ID: Tonya Guzman, female    DOB: 01/06/1961, 63 y.o.   MRN: [id number]       HPI  Patient is a 63 y.o. [id number] female who presents for dysuria, urinary frequency, and pelvic pain for 2 months.  Patient denies hematuria, urinary urgency, cloudy malordorous urine, and vaginal discharge.  Patient does not have a history of recurrent UTI.  Patient does not have a history of pyelonephritis. She only has 1 kidney.   Objective:    Resp 16   Ht 5' 7 (1.702 m)   BMI 25.06 kg/m    Lab Review  No results found for any visits on 01/24/24.  Assessment:   1. Dysuria   2. Urinary frequency      Plan:   Urine Culture Sent. Maintain adequate hydration.  May use AZO OTC prn.  Follow up if symptoms worsen or fail to improve as anticipated, and as needed.    Camelia Fetters, CMA  [facility]

## 2024-01-26 LAB — URINE CULTURE

## 2024-01-28 ENCOUNTER — Ambulatory Visit: Payer: Self-pay

## 2024-02-05 ENCOUNTER — Other Ambulatory Visit (HOSPITAL_COMMUNITY)
Admission: RE | Admit: 2024-02-05 | Discharge: 2024-02-05 | Disposition: A | Discharge status: Home / Self Care | Source: Ambulatory Visit | Attending: Obstetrics and Gynecology | Admitting: Obstetrics and Gynecology

## 2024-02-05 ENCOUNTER — Encounter: Payer: Self-pay | Admitting: Obstetrics and Gynecology

## 2024-02-05 ENCOUNTER — Ambulatory Visit: Admitting: Obstetrics and Gynecology

## 2024-02-05 VITALS — BP 91/64 | HR 89 | Ht 67.0 in | Wt 152.0 lb

## 2024-02-05 DIAGNOSIS — N951 Menopausal and female climacteric states: Secondary | ICD-10-CM | POA: Diagnosis not present

## 2024-02-05 DIAGNOSIS — Z124 Encounter for screening for malignant neoplasm of cervix: Secondary | ICD-10-CM

## 2024-02-05 DIAGNOSIS — C50911 Malignant neoplasm of unspecified site of right female breast: Secondary | ICD-10-CM | POA: Diagnosis not present

## 2024-02-05 DIAGNOSIS — Z01419 Encounter for gynecological examination (general) (routine) without abnormal findings: Secondary | ICD-10-CM

## 2024-02-05 DIAGNOSIS — Z1151 Encounter for screening for human papillomavirus (HPV): Secondary | ICD-10-CM | POA: Diagnosis not present

## 2024-02-05 DIAGNOSIS — Z170 Estrogen receptor positive status [ER+]: Secondary | ICD-10-CM

## 2024-02-05 DIAGNOSIS — N941 Unspecified dyspareunia: Secondary | ICD-10-CM

## 2024-02-05 NOTE — Patient Instructions (Signed)
 I value your feedback and you entrusting [place] with your care. If you get a [place] patient survey, I would appreciate you taking the time to let us know about your experience today. Thank you! ? ? ?

## 2024-02-05 NOTE — Progress Notes (Signed)
 PCP: Bertrum Charlie CROME, MD   Chief Complaint  Patient presents with   Gynecologic Exam    No concerns    HPI:      [name] is a 63 y.o. [phone] who LMP was No LMP recorded. Patient is postmenopausal., presents today for her annual examination.  Her menses are absent due to menopause (2015). No pelvic pain or PMB. Tolerable vasomotor sx.    Sex activity--very limited due to dyspareunia: single partner, contraception - post menopausal status. She does have vaginal dryness. She used estrace  crm in the past and liked that better than intrarosa , but can't use either anymore due to breast cancer dx last yr. Has tried coconut oil and hyaluronic acid supp with some improvement.   Last Pap: 01/29/20 Results were: no abnormalities /neg HPV DNA.   Last mammogram: 03/08/23  Results were normal, repeat in 12 months.  03/14/19  Results were: cat 4 RT breast. 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 Being followed by onc--5 yrs this yr. There is a FH of breast cancer in her mat aunt and sister, pt is gene panel neg. There is no FH of ovarian cancer. The patient does do self-breast exams.  Colonoscopy: 3/23 at [facility]; repeat due after 10 years   Tobacco use: The patient denies current or previous tobacco use. Alcohol use: social drinker  No drug use. Exercise: moderately active  DEXA 2/21 at [facility] with oncology: osteopenia  She does get adequate calcium and Vitamin D in her diet.   Labs with PCP.   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   Family history of breast cancer    Fatigue    Insomnia    Night sweats     Past Surgical History:  Procedure Laterality Date   BREAST BIOPSY Right 03/24/2019   u/s bx, HEART clip-[facility]   BREAST LUMPECTOMY Right 2020   [facility]   CESAREAN SECTION      COLONOSCOPY  2010   repeat due after 10 yrs   FOOT SURGERY Right    KIDNEY SURGERY      Family History  Problem Relation Age of Onset   Pneumonia Mother    Hypertension Mother    Anemia Mother    Hyperlipidemia Father    Hypertension Father    Heart disease Father    Heart attack Father    Cancer Father        vocal   Hypertension Sister    Breast cancer Sister        right breast   Hypertension Sister    Lung cancer Sister    Hepatitis C Brother    Breast cancer Maternal Aunt 40   Cancer Maternal Uncle        unk type   Cancer Maternal Uncle        unk type   Ovarian cancer Neg Hx    Colon cancer Neg Hx    Diabetes Neg Hx     Social History   Socioeconomic History   Marital status: Married    Spouse name: Not on file   Number of children: Not on file   Years of education: Not on file   Highest education level: Not on file  Occupational History   Not on file  Tobacco Use   Smoking status: Former    Current packs/day: 0.00    Average packs/day: 0.3 packs/day for 1 year (0.3 ttl pk-yrs)    Types: Cigarettes    Start date: 05/09/2011    Quit date: 05/08/2012    Years since quitting: 11.7   Smokeless tobacco: Never  Vaping Use   Vaping status: Never Used  Substance and Sexual Activity   Alcohol use: Yes    Comment: 3 drinks a week   Drug use: No   Sexual activity: Yes    Birth control/protection: Post-menopausal  Other Topics Concern   Not on file  Social History Narrative   Not on file   Social Drivers of Health   Financial Resource Strain: Low Risk  (01/22/2024)   Received from [facility]   Overall Financial Resource Strain (CARDIA)    Difficulty of Paying Living Expenses: Not very hard  Food Insecurity: No Food Insecurity (01/22/2024)   Received from [facility]   Hunger Vital Sign    Within the past 12 months, you worried that your food would run out before you got the money to buy more.: Never true    Within  the past 12 months, the food you bought just didn't last and you didn't have money to get more.: Never true  Transportation Needs: No Transportation Needs (01/22/2024)   Received from [facility] - Transportation    In the past 12 months, has lack of transportation kept you from medical appointments or from getting medications?: No    Lack of Transportation (Non-Medical): No  Physical Activity: Insufficiently Active (01/22/2024)   Received from [facility]   Exercise Vital Sign    On average, how many days per week do you engage in moderate to strenuous exercise (like a brisk walk)?: 3 days    On average, how many minutes do you engage in exercise at this level?: 20 min  Stress: Not on file  Social Connections: Not on file  Intimate Partner Violence: Not on file     Current Outpatient Medications:    ALPRAZolam  (XANAX ) 0.5 MG tablet, Take 1 tablet (0.5 mg total) by mouth at bedtime as needed. Please schedule office visit before any future refill., Disp: 30 tablet, Rfl: 0   aspirin EC 81 MG tablet, Take 81 mg by mouth daily., Disp: , Rfl:    Magnesium Chloride (MAGNESIUM DR PO), Take by mouth., Disp: , Rfl:    meloxicam (MOBIC) 15 MG tablet, Take 15 mg by mouth., Disp: , Rfl:    NASONEX  50 MCG/ACT nasal spray, Reported on 07/21/2015, Disp: , Rfl:    triamcinolone cream (KENALOG) 0.5 %, Apply topically., Disp: , Rfl:      ROS:  Review of Systems  Constitutional:  Negative for fatigue, fever and unexpected weight change.  Respiratory:  Negative for cough, shortness of breath and wheezing.   Cardiovascular:  Negative for chest pain, palpitations and leg swelling.  Gastrointestinal:  Negative for blood in stool, constipation, diarrhea, nausea and vomiting.  Endocrine: Negative for cold intolerance, heat intolerance and polyuria.  Genitourinary:  Positive for dyspareunia and dysuria. Negative for flank pain, frequency, genital sores, hematuria,  menstrual problem, pelvic pain, urgency, vaginal bleeding, vaginal discharge and vaginal pain.  Musculoskeletal:  Negative for back pain, joint swelling and myalgias.  Skin:  Negative for rash.  Neurological:  Negative for dizziness, syncope, light-headedness, numbness and headaches.  Hematological:  Negative for  adenopathy.  Psychiatric/Behavioral:  Negative for agitation, confusion, sleep disturbance and suicidal ideas. The patient is not nervous/anxious.    BREAST: none    Objective: BP 91/64   Pulse 89   Ht 5' 7 (1.702 m)   Wt 152 lb (68.9 kg)   BMI 23.81 kg/m    Physical Exam Constitutional:      Appearance: She is well-developed.  Genitourinary:     Vulva normal.     Right Labia: No rash, tenderness or lesions.    Left Labia: No tenderness, lesions or rash.    No vaginal discharge, erythema or tenderness.     Moderate vaginal atrophy present.     Right Adnexa: not tender and no mass present.    Left Adnexa: not tender and no mass present.    No cervical friability or polyp.     Uterus is not enlarged or tender.  Breasts:    Right: Mass present. No nipple discharge, skin change or tenderness.     Left: No mass, nipple discharge, skin change or tenderness.  Neck:     Thyroid: No thyromegaly.  Cardiovascular:     Rate and Rhythm: Normal rate and regular rhythm.     Heart sounds: Normal heart sounds. No murmur heard. Pulmonary:     Effort: Pulmonary effort is normal.     Breath sounds: Normal breath sounds.  Chest:    Abdominal:     Palpations: Abdomen is soft.     Tenderness: There is no abdominal tenderness. There is no guarding or rebound.  Musculoskeletal:        General: Normal range of motion.     Cervical back: Normal range of motion.  Lymphadenopathy:     Cervical: No cervical adenopathy.  Neurological:     General: No focal deficit present.     Mental Status: She is alert and oriented to person, place, and time.     Cranial Nerves: No cranial  nerve deficit.  Skin:    General: Skin is warm and dry.  Psychiatric:        Mood and Affect: Mood normal.        Behavior: Behavior normal.        Thought Content: Thought content normal.        Judgment: Judgment normal.  Vitals reviewed.    Assessment/Plan: Encounter for annual routine gynecological examination  Cervical cancer screening - Plan: Cytology - PAP  Screening for HPV (human papillomavirus) - Plan: Cytology - PAP  Malignant neoplasm of right breast in female, estrogen receptor positive, unspecified site of breast (HCC); followed by onc  Vaginal dryness, menopausal--try coconut oil inside vaginally.   Dyspareunia in female--can't have vag ERT, doing better with hyaluronic acid supp. Cont coconut oil. F/u prn.         GYN counsel breast self exam, mammography screening, menopause, adequate intake of calcium and vitamin D, diet and exercise    F/U  Return in about 1 year (around 02/04/2025).  Jamicia Haaland B. Eithen Castiglia, PA-C 02/05/2024 11:12 AM

## 2024-02-06 LAB — CYTOLOGY - PAP
Comment: NEGATIVE
Diagnosis: NEGATIVE
High risk HPV: NEGATIVE
